# Patient Record
Sex: Male | Born: 1935
Health system: Southern US, Community
[De-identification: ages and names within clinical notes are randomized; demographics above are authoritative.]

## PROBLEM LIST (undated history)

## (undated) DIAGNOSIS — I10 Essential (primary) hypertension: Secondary | ICD-10-CM

## (undated) DIAGNOSIS — G14 Postpolio syndrome: Secondary | ICD-10-CM

## (undated) DIAGNOSIS — C189 Malignant neoplasm of colon, unspecified: Secondary | ICD-10-CM

## (undated) DIAGNOSIS — I639 Cerebral infarction, unspecified: Secondary | ICD-10-CM

## (undated) DIAGNOSIS — R0902 Hypoxemia: Secondary | ICD-10-CM

## (undated) DIAGNOSIS — E785 Hyperlipidemia, unspecified: Secondary | ICD-10-CM

## (undated) DIAGNOSIS — E079 Disorder of thyroid, unspecified: Secondary | ICD-10-CM

## (undated) DIAGNOSIS — C439 Malignant melanoma of skin, unspecified: Secondary | ICD-10-CM

## (undated) DIAGNOSIS — F419 Anxiety disorder, unspecified: Secondary | ICD-10-CM

## (undated) DIAGNOSIS — H269 Unspecified cataract: Secondary | ICD-10-CM

## (undated) DIAGNOSIS — M109 Gout, unspecified: Secondary | ICD-10-CM

## (undated) HISTORY — DX: Postpolio syndrome: G14

## (undated) HISTORY — DX: Unspecified cataract: H26.9

## (undated) HISTORY — DX: Cerebral infarction, unspecified: I63.9

## (undated) HISTORY — DX: Malignant neoplasm of colon, unspecified: C18.9

## (undated) HISTORY — PX: COLON SURGERY: SHX602

## (undated) HISTORY — DX: Hyperlipidemia, unspecified: E78.5

## (undated) HISTORY — PX: HERNIA REPAIR: SHX51

## (undated) HISTORY — DX: Disorder of thyroid, unspecified: E07.9

## (undated) HISTORY — DX: Hypoxemia: R09.02

## (undated) HISTORY — PX: MELANOMA EXCISION: SHX5266

## (undated) HISTORY — DX: Essential (primary) hypertension: I10

## (undated) HISTORY — PX: CHOLECYSTECTOMY: SHX55

## (undated) HISTORY — DX: Anxiety disorder, unspecified: F41.9

## (undated) HISTORY — PX: CATARACT EXTRACTION: SUR2

## (undated) HISTORY — DX: Malignant melanoma of skin, unspecified: C43.9

## (undated) HISTORY — PX: CARPAL TUNNEL RELEASE: SHX101

## (undated) HISTORY — DX: Gout, unspecified: M10.9

## (undated) HISTORY — PX: APPENDECTOMY: SHX54

## (undated) HISTORY — PX: FRACTURE SURGERY: SHX138

---

## 2010-03-10 DIAGNOSIS — C189 Malignant neoplasm of colon, unspecified: Secondary | ICD-10-CM

## 2010-03-10 HISTORY — DX: Malignant neoplasm of colon, unspecified: C18.9

## 2013-09-13 LAB — HM COLONOSCOPY

## 2016-04-14 DIAGNOSIS — M25511 Pain in right shoulder: Secondary | ICD-10-CM | POA: Diagnosis not present

## 2016-04-14 DIAGNOSIS — M75121 Complete rotator cuff tear or rupture of right shoulder, not specified as traumatic: Secondary | ICD-10-CM | POA: Diagnosis not present

## 2016-06-04 DIAGNOSIS — N471 Phimosis: Secondary | ICD-10-CM | POA: Diagnosis not present

## 2016-06-04 DIAGNOSIS — R351 Nocturia: Secondary | ICD-10-CM | POA: Diagnosis not present

## 2016-06-09 DIAGNOSIS — H6120 Impacted cerumen, unspecified ear: Secondary | ICD-10-CM | POA: Diagnosis not present

## 2016-06-12 DIAGNOSIS — Z01818 Encounter for other preprocedural examination: Secondary | ICD-10-CM | POA: Diagnosis not present

## 2016-06-12 DIAGNOSIS — N471 Phimosis: Secondary | ICD-10-CM | POA: Diagnosis not present

## 2016-06-17 DIAGNOSIS — Q549 Hypospadias, unspecified: Secondary | ICD-10-CM | POA: Diagnosis not present

## 2016-06-17 DIAGNOSIS — G309 Alzheimer's disease, unspecified: Secondary | ICD-10-CM | POA: Diagnosis not present

## 2016-06-17 DIAGNOSIS — N481 Balanitis: Secondary | ICD-10-CM | POA: Diagnosis not present

## 2016-06-17 DIAGNOSIS — R351 Nocturia: Secondary | ICD-10-CM | POA: Diagnosis not present

## 2016-06-17 DIAGNOSIS — I10 Essential (primary) hypertension: Secondary | ICD-10-CM | POA: Diagnosis not present

## 2016-06-17 DIAGNOSIS — Z6832 Body mass index (BMI) 32.0-32.9, adult: Secondary | ICD-10-CM | POA: Diagnosis not present

## 2016-06-17 DIAGNOSIS — F329 Major depressive disorder, single episode, unspecified: Secondary | ICD-10-CM | POA: Diagnosis not present

## 2016-06-17 DIAGNOSIS — N400 Enlarged prostate without lower urinary tract symptoms: Secondary | ICD-10-CM | POA: Diagnosis not present

## 2016-06-17 DIAGNOSIS — N471 Phimosis: Secondary | ICD-10-CM | POA: Diagnosis not present

## 2016-06-17 DIAGNOSIS — E039 Hypothyroidism, unspecified: Secondary | ICD-10-CM | POA: Diagnosis not present

## 2016-06-17 DIAGNOSIS — E669 Obesity, unspecified: Secondary | ICD-10-CM | POA: Diagnosis not present

## 2016-06-17 DIAGNOSIS — F419 Anxiety disorder, unspecified: Secondary | ICD-10-CM | POA: Diagnosis not present

## 2016-06-18 DIAGNOSIS — N471 Phimosis: Secondary | ICD-10-CM | POA: Diagnosis not present

## 2016-06-18 DIAGNOSIS — Q549 Hypospadias, unspecified: Secondary | ICD-10-CM | POA: Diagnosis not present

## 2016-06-18 DIAGNOSIS — N481 Balanitis: Secondary | ICD-10-CM | POA: Diagnosis not present

## 2016-07-04 DIAGNOSIS — D7589 Other specified diseases of blood and blood-forming organs: Secondary | ICD-10-CM | POA: Diagnosis not present

## 2016-07-04 DIAGNOSIS — I10 Essential (primary) hypertension: Secondary | ICD-10-CM | POA: Diagnosis not present

## 2016-07-08 DIAGNOSIS — Z7189 Other specified counseling: Secondary | ICD-10-CM | POA: Diagnosis not present

## 2016-07-08 DIAGNOSIS — Z0000 Encounter for general adult medical examination without abnormal findings: Secondary | ICD-10-CM | POA: Diagnosis not present

## 2016-07-08 DIAGNOSIS — Z85038 Personal history of other malignant neoplasm of large intestine: Secondary | ICD-10-CM | POA: Diagnosis not present

## 2016-07-08 DIAGNOSIS — B91 Sequelae of poliomyelitis: Secondary | ICD-10-CM | POA: Diagnosis not present

## 2016-07-08 DIAGNOSIS — I10 Essential (primary) hypertension: Secondary | ICD-10-CM | POA: Diagnosis not present

## 2016-07-08 DIAGNOSIS — E039 Hypothyroidism, unspecified: Secondary | ICD-10-CM | POA: Diagnosis not present

## 2016-07-08 DIAGNOSIS — E785 Hyperlipidemia, unspecified: Secondary | ICD-10-CM | POA: Diagnosis not present

## 2016-08-13 DIAGNOSIS — H903 Sensorineural hearing loss, bilateral: Secondary | ICD-10-CM | POA: Diagnosis not present

## 2016-08-13 DIAGNOSIS — Z974 Presence of external hearing-aid: Secondary | ICD-10-CM | POA: Diagnosis not present

## 2016-09-17 DIAGNOSIS — I10 Essential (primary) hypertension: Secondary | ICD-10-CM | POA: Diagnosis not present

## 2016-09-17 DIAGNOSIS — R0902 Hypoxemia: Secondary | ICD-10-CM | POA: Diagnosis not present

## 2016-09-17 DIAGNOSIS — I080 Rheumatic disorders of both mitral and aortic valves: Secondary | ICD-10-CM | POA: Diagnosis not present

## 2016-09-22 DIAGNOSIS — M25512 Pain in left shoulder: Secondary | ICD-10-CM | POA: Diagnosis not present

## 2016-09-22 DIAGNOSIS — M7552 Bursitis of left shoulder: Secondary | ICD-10-CM | POA: Diagnosis not present

## 2016-10-22 DIAGNOSIS — L218 Other seborrheic dermatitis: Secondary | ICD-10-CM | POA: Diagnosis not present

## 2016-10-22 DIAGNOSIS — Z85828 Personal history of other malignant neoplasm of skin: Secondary | ICD-10-CM | POA: Diagnosis not present

## 2016-10-22 DIAGNOSIS — L578 Other skin changes due to chronic exposure to nonionizing radiation: Secondary | ICD-10-CM | POA: Diagnosis not present

## 2016-10-22 DIAGNOSIS — Z85820 Personal history of malignant melanoma of skin: Secondary | ICD-10-CM | POA: Diagnosis not present

## 2016-12-15 DIAGNOSIS — Z23 Encounter for immunization: Secondary | ICD-10-CM | POA: Diagnosis not present

## 2016-12-17 DIAGNOSIS — Z974 Presence of external hearing-aid: Secondary | ICD-10-CM | POA: Diagnosis not present

## 2016-12-17 DIAGNOSIS — H903 Sensorineural hearing loss, bilateral: Secondary | ICD-10-CM | POA: Diagnosis not present

## 2016-12-29 DIAGNOSIS — E039 Hypothyroidism, unspecified: Secondary | ICD-10-CM | POA: Diagnosis not present

## 2016-12-29 DIAGNOSIS — E785 Hyperlipidemia, unspecified: Secondary | ICD-10-CM | POA: Diagnosis not present

## 2017-03-05 DIAGNOSIS — I10 Essential (primary) hypertension: Secondary | ICD-10-CM | POA: Diagnosis not present

## 2017-03-05 DIAGNOSIS — R413 Other amnesia: Secondary | ICD-10-CM | POA: Diagnosis not present

## 2017-03-05 DIAGNOSIS — E039 Hypothyroidism, unspecified: Secondary | ICD-10-CM | POA: Diagnosis not present

## 2017-03-05 DIAGNOSIS — R0902 Hypoxemia: Secondary | ICD-10-CM | POA: Diagnosis not present

## 2017-03-05 DIAGNOSIS — E785 Hyperlipidemia, unspecified: Secondary | ICD-10-CM | POA: Diagnosis not present

## 2017-03-05 DIAGNOSIS — M25519 Pain in unspecified shoulder: Secondary | ICD-10-CM | POA: Diagnosis not present

## 2017-04-29 DIAGNOSIS — H9193 Unspecified hearing loss, bilateral: Secondary | ICD-10-CM | POA: Diagnosis not present

## 2017-04-29 DIAGNOSIS — I10 Essential (primary) hypertension: Secondary | ICD-10-CM | POA: Diagnosis not present

## 2017-04-29 DIAGNOSIS — H6122 Impacted cerumen, left ear: Secondary | ICD-10-CM | POA: Diagnosis not present

## 2017-05-26 DIAGNOSIS — E039 Hypothyroidism, unspecified: Secondary | ICD-10-CM | POA: Diagnosis not present

## 2017-05-26 LAB — HEPATIC FUNCTION PANEL
ALT: 35 (ref 10–40)
AST: 45 — AB (ref 14–40)
Alkaline Phosphatase: 73 (ref 25–125)

## 2017-05-26 LAB — BASIC METABOLIC PANEL
BUN: 21 (ref 4–21)
CREATININE: 0.8 (ref 0.6–1.3)
Glucose: 91
Potassium: 4.5 (ref 3.4–5.3)
Sodium: 142 (ref 137–147)

## 2017-05-26 LAB — TSH: TSH: 7.98 — AB (ref 0.41–5.90)

## 2017-06-01 DIAGNOSIS — I10 Essential (primary) hypertension: Secondary | ICD-10-CM | POA: Diagnosis not present

## 2017-06-01 DIAGNOSIS — E039 Hypothyroidism, unspecified: Secondary | ICD-10-CM | POA: Diagnosis not present

## 2017-06-25 DIAGNOSIS — R0902 Hypoxemia: Secondary | ICD-10-CM | POA: Diagnosis not present

## 2017-06-25 DIAGNOSIS — I10 Essential (primary) hypertension: Secondary | ICD-10-CM | POA: Diagnosis not present

## 2017-06-25 DIAGNOSIS — E039 Hypothyroidism, unspecified: Secondary | ICD-10-CM | POA: Diagnosis not present

## 2017-06-25 DIAGNOSIS — B91 Sequelae of poliomyelitis: Secondary | ICD-10-CM | POA: Diagnosis not present

## 2017-09-17 ENCOUNTER — Ambulatory Visit: Payer: Self-pay | Admitting: Family Medicine

## 2017-09-18 ENCOUNTER — Ambulatory Visit (INDEPENDENT_AMBULATORY_CARE_PROVIDER_SITE_OTHER): Payer: Medicare Other | Admitting: Family Medicine

## 2017-09-18 ENCOUNTER — Ambulatory Visit: Payer: Self-pay | Admitting: Family Medicine

## 2017-09-18 ENCOUNTER — Encounter: Payer: Self-pay | Admitting: Family Medicine

## 2017-09-18 VITALS — BP 122/68 | HR 64 | Temp 98.4°F | Resp 16 | Wt 237.0 lb

## 2017-09-18 DIAGNOSIS — G14 Postpolio syndrome: Secondary | ICD-10-CM | POA: Diagnosis not present

## 2017-09-18 DIAGNOSIS — H9193 Unspecified hearing loss, bilateral: Secondary | ICD-10-CM | POA: Diagnosis not present

## 2017-09-18 DIAGNOSIS — Z85038 Personal history of other malignant neoplasm of large intestine: Secondary | ICD-10-CM | POA: Diagnosis not present

## 2017-09-18 DIAGNOSIS — I10 Essential (primary) hypertension: Secondary | ICD-10-CM | POA: Diagnosis not present

## 2017-09-18 DIAGNOSIS — E039 Hypothyroidism, unspecified: Secondary | ICD-10-CM | POA: Diagnosis not present

## 2017-09-18 DIAGNOSIS — Z8673 Personal history of transient ischemic attack (TIA), and cerebral infarction without residual deficits: Secondary | ICD-10-CM

## 2017-09-18 DIAGNOSIS — H6120 Impacted cerumen, unspecified ear: Secondary | ICD-10-CM | POA: Insufficient documentation

## 2017-09-18 DIAGNOSIS — H6122 Impacted cerumen, left ear: Secondary | ICD-10-CM | POA: Diagnosis not present

## 2017-09-18 DIAGNOSIS — R4189 Other symptoms and signs involving cognitive functions and awareness: Secondary | ICD-10-CM

## 2017-09-18 DIAGNOSIS — Z85820 Personal history of malignant melanoma of skin: Secondary | ICD-10-CM | POA: Diagnosis not present

## 2017-09-18 DIAGNOSIS — H9190 Unspecified hearing loss, unspecified ear: Secondary | ICD-10-CM | POA: Insufficient documentation

## 2017-09-18 DIAGNOSIS — M1A9XX0 Chronic gout, unspecified, without tophus (tophi): Secondary | ICD-10-CM | POA: Diagnosis not present

## 2017-09-18 NOTE — Patient Instructions (Signed)
Earwax Buildup, Adult The ears produce a substance called earwax that helps keep bacteria out of the ear and protects the skin in the ear canal. Occasionally, earwax can build up in the ear and cause discomfort or hearing loss. What increases the risk? This condition is more likely to develop in people who:  Are male.  Are elderly.  Naturally produce more earwax.  Clean their ears often with cotton swabs.  Use earplugs often.  Use in-ear headphones often.  Wear hearing aids.  Have narrow ear canals.  Have earwax that is overly thick or sticky.  Have eczema.  Are dehydrated.  Have excess hair in the ear canal.  What are the signs or symptoms? Symptoms of this condition include:  Reduced or muffled hearing.  A feeling of fullness in the ear or feeling that the ear is plugged.  Fluid coming from the ear.  Ear pain.  Ear itch.  Ringing in the ear.  Coughing.  An obvious piece of earwax that can be seen inside the ear canal.  How is this diagnosed? This condition may be diagnosed based on:  Your symptoms.  Your medical history.  An ear exam. During the exam, your health care provider will look into your ear with an instrument called an otoscope.  You may have tests, including a hearing test. How is this treated? This condition may be treated by:  Using ear drops to soften the earwax.  Having the earwax removed by a health care provider. The health care provider may: ? Flush the ear with water. ? Use an instrument that has a loop on the end (curette). ? Use a suction device.  Surgery to remove the wax buildup. This may be done in severe cases.  Follow these instructions at home:  Take over-the-counter and prescription medicines only as told by your health care provider.  Do not put any objects, including cotton swabs, into your ear. You can clean the opening of your ear canal with a washcloth or facial tissue.  Follow instructions from your health  care provider about cleaning your ears. Do not over-clean your ears.  Drink enough fluid to keep your urine clear or pale yellow. This will help to thin the earwax.  Keep all follow-up visits as told by your health care provider. If earwax builds up in your ears often or if you use hearing aids, consider seeing your health care provider for routine, preventive ear cleanings. Ask your health care provider how often you should schedule your cleanings.  If you have hearing aids, clean them according to instructions from the manufacturer and your health care provider. Contact a health care provider if:  You have ear pain.  You develop a fever.  You have blood, pus, or other fluid coming from your ear.  You have hearing loss.  You have ringing in your ears that does not go away.  Your symptoms do not improve with treatment.  You feel like the room is spinning (vertigo). Summary  Earwax can build up in the ear and cause discomfort or hearing loss.  The most common symptoms of this condition include reduced or muffled hearing and a feeling of fullness in the ear or feeling that the ear is plugged.  This condition may be diagnosed based on your symptoms, your medical history, and an ear exam.  This condition may be treated by using ear drops to soften the earwax or by having the earwax removed by a health care provider.  Do   not put any objects, including cotton swabs, into your ear. You can clean the opening of your ear canal with a washcloth or facial tissue. This information is not intended to replace advice given to you by your health care provider. Make sure you discuss any questions you have with your health care provider. Document Released: 04/03/2004 Document Revised: 05/07/2016 Document Reviewed: 05/07/2016 Elsevier Interactive Patient Education  2018 [employer]vier Inc[employer]  
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
## 2017-09-22 DIAGNOSIS — M109 Gout, unspecified: Secondary | ICD-10-CM | POA: Insufficient documentation

## 2017-09-22 NOTE — Assessment & Plan Note (Signed)
Despite wearing hearing aids today, patient seems to have significant hearing loss that affects his ability to participate in our conversation Referral to ENT to see if there are any other interventions that may help with this as it seems to affect his quality of life

## 2017-09-22 NOTE — Assessment & Plan Note (Signed)
Reportedly previously well controlled Declines labs as above as he recently had these at previous PCP We will request records before we obtain labs here Can continue Synthroid 112 mcg daily

## 2017-09-22 NOTE — Assessment & Plan Note (Signed)
Referral to dermatology for ongoing management and surveillance

## 2017-09-22 NOTE — Assessment & Plan Note (Signed)
Referral to GI for ongoing management and surveillance

## 2017-09-22 NOTE — Assessment & Plan Note (Signed)
Chronic and stable with no recent flares Continue allopurinol Continue colchicine as needed Check uric acid level with next labs 1 week and get these

## 2017-09-22 NOTE — Assessment & Plan Note (Signed)
Patient with right leg weakness secondary to post polio syndrome Needs new bracing and assistive devices Referral to PM&R for evaluation and management Do not think that patient is safe to drive and will provide DMV letter to daughter in law recommending such

## 2017-09-22 NOTE — Assessment & Plan Note (Signed)
Currently well controlled Continue atenolol and nifedipine at current doses Patient reports recent lab work done at previous PCP in [place] and declines lab work today We will request records before obtaining labs here

## 2017-09-22 NOTE — Assessment & Plan Note (Signed)
Patient is without deficits currently Continue aspirin and good blood pressure and cholesterol control

## 2017-09-22 NOTE — Assessment & Plan Note (Signed)
Chronic and stable per family Continue Aricept 10 mg nightly

## 2017-09-22 NOTE — Assessment & Plan Note (Signed)
May be contributing to some of his hearing loss It is very thick and hardened and will be difficult to remove He is being referred to ENT as above, so I hope they will be able to help with this as well

## 2017-10-06 DIAGNOSIS — H903 Sensorineural hearing loss, bilateral: Secondary | ICD-10-CM | POA: Diagnosis not present

## 2017-10-06 DIAGNOSIS — H6122 Impacted cerumen, left ear: Secondary | ICD-10-CM | POA: Diagnosis not present

## 2017-10-12 ENCOUNTER — Encounter: Payer: Self-pay | Admitting: *Deleted

## 2017-10-21 ENCOUNTER — Ambulatory Visit: Payer: Medicare Other | Admitting: Family Medicine

## 2017-10-21 ENCOUNTER — Other Ambulatory Visit: Payer: Self-pay

## 2017-10-21 MED ORDER — ATENOLOL 25 MG PO TABS: 25.0000 mg | ORAL_TABLET | Freq: Every day | ORAL | 3 refills | Status: DC

## 2017-10-21 MED ORDER — ALLOPURINOL 300 MG PO TABS: 300.0000 mg | ORAL_TABLET | Freq: Every day | ORAL | 3 refills | Status: DC

## 2017-10-21 MED ORDER — COLCHICINE 0.6 MG PO TABS: 0.6000 mg | ORAL_TABLET | Freq: Every day | ORAL | 3 refills | Status: DC

## 2017-10-28 DIAGNOSIS — L219 Seborrheic dermatitis, unspecified: Secondary | ICD-10-CM | POA: Diagnosis not present

## 2017-10-28 DIAGNOSIS — L820 Inflamed seborrheic keratosis: Secondary | ICD-10-CM | POA: Diagnosis not present

## 2017-10-28 DIAGNOSIS — Z85820 Personal history of malignant melanoma of skin: Secondary | ICD-10-CM | POA: Diagnosis not present

## 2017-10-28 DIAGNOSIS — L821 Other seborrheic keratosis: Secondary | ICD-10-CM | POA: Diagnosis not present

## 2017-10-28 DIAGNOSIS — D1800 Hemangioma unspecified site: Secondary | ICD-10-CM | POA: Diagnosis not present

## 2017-10-28 DIAGNOSIS — Z1283 Encounter for screening for malignant neoplasm of skin: Secondary | ICD-10-CM | POA: Diagnosis not present

## 2017-10-28 DIAGNOSIS — R21 Rash and other nonspecific skin eruption: Secondary | ICD-10-CM | POA: Diagnosis not present

## 2017-11-05 ENCOUNTER — Encounter: Payer: Self-pay | Admitting: Family Medicine

## 2017-11-05 NOTE — Progress Notes (Signed)
Medical records received from [facility] in [place].  Medical history, health maintenance, and vaccinations updated in the chart.  Most recent labs abstracted  Bacigalupo, Dionne Bucy, MD, MPH [facility] 11/05/2017 11:41 AM

## 2017-11-20 DIAGNOSIS — G14 Postpolio syndrome: Secondary | ICD-10-CM | POA: Diagnosis not present

## 2017-11-20 DIAGNOSIS — I6302 Cerebral infarction due to thrombosis of basilar artery: Secondary | ICD-10-CM | POA: Diagnosis not present

## 2017-11-25 ENCOUNTER — Ambulatory Visit (INDEPENDENT_AMBULATORY_CARE_PROVIDER_SITE_OTHER): Payer: Medicare Other | Admitting: Family Medicine

## 2017-11-25 ENCOUNTER — Encounter: Payer: Self-pay | Admitting: Family Medicine

## 2017-11-25 VITALS — BP 144/82 | HR 59 | Temp 97.8°F | Ht 72.0 in | Wt 231.4 lb

## 2017-11-25 DIAGNOSIS — Z7189 Other specified counseling: Secondary | ICD-10-CM | POA: Diagnosis not present

## 2017-11-25 DIAGNOSIS — Z23 Encounter for immunization: Secondary | ICD-10-CM | POA: Diagnosis not present

## 2017-11-25 DIAGNOSIS — I10 Essential (primary) hypertension: Secondary | ICD-10-CM | POA: Diagnosis not present

## 2017-11-25 DIAGNOSIS — Z8673 Personal history of transient ischemic attack (TIA), and cerebral infarction without residual deficits: Secondary | ICD-10-CM | POA: Diagnosis not present

## 2017-11-25 DIAGNOSIS — M1A9XX0 Chronic gout, unspecified, without tophus (tophi): Secondary | ICD-10-CM | POA: Diagnosis not present

## 2017-11-25 DIAGNOSIS — E039 Hypothyroidism, unspecified: Secondary | ICD-10-CM

## 2017-11-25 DIAGNOSIS — E785 Hyperlipidemia, unspecified: Secondary | ICD-10-CM

## 2017-11-25 DIAGNOSIS — G14 Postpolio syndrome: Secondary | ICD-10-CM

## 2017-11-25 NOTE — Progress Notes (Signed)
Patient: Samuel Jones Male    DOB: 12/17/1933   84 y.o.   MRN: [id number] Visit Date: 11/25/2017  Today's Provider: Lavon Paganini, MD   Chief Complaint  Patient presents with  . Hypertension  . Hypothyroidism   Subjective:    I, Tiburcio Pea, CMA, am acting as a Education administrator for Lavon Paganini, MD.   HPI  Hypertension, follow-up:  BP Readings from Last 3 Encounters:  11/25/17 (!) 144/82  09/18/17 122/68    He was last seen for hypertension 2 months ago.  BP at that visit was 122/68. Management changes since that visit include no changes continue medication. He reports good compliance with treatment. He is not having side effects.  He is not exercising. He is not adherent to low salt diet.   Outside blood pressures are not being checked at home.  At recent visit at [facility] was well controlled. Believe that it is elevated due to argument on the way here today. He is experiencing none.  Patient denies chest pain, chest pressure/discomfort, claudication, dyspnea, exertional chest pressure/discomfort, fatigue, irregular heart beat, lower extremity edema, near-syncope, orthopnea, palpitations, paroxysmal nocturnal dyspnea, syncope and tachypnea.   Cardiovascular risk factors include advanced age (older than 29 for men, 8 for women), hypertension and male gender.  Use of agents associated with hypertension: thyroid hormones.     Weight trend: stable Wt Readings from Last 3 Encounters:  11/25/17 231 lb 6.4 oz (105 kg)  09/18/17 237 lb (107.5 kg)    Current diet: in general, an "unhealthy" diet  ------------------------------------------------------------------------  Hypothyroid, follow-up:  TSH  Date Value Ref Range Status  05/26/2017 7.98 (A) 0.41 - 5.90 Final   Wt Readings from Last 3 Encounters:  11/25/17 231 lb 6.4 oz (105 kg)  09/18/17 237 lb (107.5 kg)    He was last seen for hypothyroid 2 months ago.  Management since that visit includes continue  medication. He reports good compliance with treatment. He is not having side effects.  He is not exercising. He is experiencing none He denies change in energy level, diarrhea, heat / cold intolerance, nervousness, palpitations and weight changes Weight trend: stable  ------------------------------------------------------------------------ Discussed options for interventions in the future.  Patient has h/o colon cancer and is supposed to have repeat colonoscopy.  He states that he does not wish to have invasive tests.  He knows there is a chance of colon cancer recurrence, but he states that he would not treat it if that were the case anyways.  He does wish to proceed with physical therapy and leg bracing minimally invasive things that can improve his lifestyle.  He will continue to get lab work.  He would think about hospitalization if this was needed.  He is okay with antibiotics and medical interventions such as that.  He is unsure whether he would take a statin even when considering his previous CVA.  His son, who is his healthcare power of attorney, is present for this conversation.  He agrees with the discussion.  The patient also states that he would like to continue to eat what he wants and do what he wants.     No Known Allergies   Current Outpatient Medications:  .  acetaminophen (TYLENOL) 325 MG tablet, Take 650 mg by mouth every 6 (six) hours as needed., Disp: , Rfl:  .  allopurinol (ZYLOPRIM) 300 MG tablet, Take 1 tablet (300 mg total) by mouth daily., Disp: 30 tablet, Rfl: 3 .  aspirin EC  81 MG tablet, Take 81 mg by mouth daily., Disp: , Rfl:  .  atenolol (TENORMIN) 25 MG tablet, Take 1 tablet (25 mg total) by mouth daily., Disp: 30 tablet, Rfl: 3 .  clobetasol (TEMOVATE) 0.05 % external solution, Apply 1 application topically 2 (two) times daily., Disp: , Rfl:  .  colchicine 0.6 MG tablet, Take 1 tablet (0.6 mg total) by mouth daily., Disp: 30 tablet, Rfl: 3 .  donepezil  (ARICEPT) 10 MG tablet, Take 10 mg by mouth at bedtime., Disp: , Rfl:  .  fluticasone (FLONASE) 50 MCG/ACT nasal spray, Place into both nostrils daily., Disp: , Rfl:  .  ketoconazole (NIZORAL) 2 % shampoo, Apply 1 application topically 2 (two) times a week., Disp: , Rfl:  .  levothyroxine (SYNTHROID, LEVOTHROID) 112 MCG tablet, Take 112 mcg by mouth daily before breakfast., Disp: , Rfl:  .  Multiple Vitamin (MULTIVITAMIN) capsule, Take 1 capsule by mouth daily., Disp: , Rfl:  .  Multiple Vitamins-Minerals (PRESERVISION AREDS PO), Take by mouth., Disp: , Rfl:  .  NIFEdipine (PROCARDIA-XL/ADALAT CC) 30 MG 24 hr tablet, Take 60 mg by mouth daily., Disp: , Rfl:  .  nystatin-triamcinolone (MYCOLOG II) cream, Apply 1 application topically 2 (two) times daily., Disp: , Rfl:  .  Omega-3 Fatty Acids (FISH OIL) 1000 MG CAPS, Take 3 capsules by mouth daily., Disp: , Rfl:  .  OXYGEN, Inhale into the lungs. 4 Liters O2 nightly, Disp: , Rfl:   Review of Systems  Constitutional: Negative.   HENT: Negative.   Respiratory: Negative.   Cardiovascular: Negative.   Gastrointestinal: Negative.   Genitourinary: Negative.   Musculoskeletal: Negative.   Neurological: Negative.   Psychiatric/Behavioral: Negative.     Social History   Tobacco Use  . Smoking status: Former Smoker    Types: Cigarettes    Last attempt to quit: 03/09/1974    Years since quitting: 43.7  . Smokeless tobacco: Never Used  Substance Use Topics  . Alcohol use: Never    Frequency: Never   Objective:   BP (!) 144/82 (BP Location: Left Arm, Patient Position: Sitting, Cuff Size: Large)   Pulse (!) 59   Temp 97.8 F (36.6 C) (Oral)   Ht 6' (1.829 m)   Wt 231 lb 6.4 oz (105 kg)   SpO2 95%   BMI 31.38 kg/m  Vitals:   11/25/17 1556  BP: (!) 144/82  Pulse: (!) 59  Temp: 97.8 F (36.6 C)  TempSrc: Oral  SpO2: 95%  Weight: 231 lb 6.4 oz (105 kg)  Height: 6' (1.829 m)     Physical Exam  Constitutional: He is oriented to  person, place, and time. He appears well-developed and well-nourished. No distress.  Walks with walker  HENT:  Head: Normocephalic and atraumatic.  Mouth/Throat: Oropharynx is clear and moist.  Eyes: Pupils are equal, round, and reactive to light. Conjunctivae are normal. Right eye exhibits no discharge. Left eye exhibits no discharge. No scleral icterus.  Neck: Neck supple. No thyromegaly present.  Cardiovascular: Normal rate, regular rhythm, normal heart sounds and intact distal pulses.  No murmur heard. Pulmonary/Chest: Effort normal and breath sounds normal. No respiratory distress. He has no wheezes. He has no rales.  Abdominal: Soft. He exhibits no distension. There is no tenderness.  Musculoskeletal: He exhibits no edema.  Lymphadenopathy:    He has no cervical adenopathy.  Neurological: He is alert and oriented to person, place, and time.  Skin: Skin is warm and dry. Capillary refill  takes less than 2 seconds. No rash noted.  Psychiatric: He has a normal mood and affect. His behavior is normal.  Vitals reviewed.      Assessment & Plan:   Problem List Items Addressed This Visit      Cardiovascular and Mediastinum   Essential hypertension - Primary    Chronic, slightly elevated today Has been well controlled at several recent visits Seems to be elevated today due to argument Continue atenolol and nifedipine at current doses Reviewed last labs Check CMP today      Relevant Orders   Comprehensive metabolic panel (Completed)     Endocrine   Hypothyroidism    Recheck TSH Continue Synthroid at current dose Pending lab results, consider dose titration as indicated      Relevant Orders   TSH (Completed)     Nervous and Auditory   Post-polio syndrome    Patient with right leg weakness secondary to post polio syndrome He has been seen by [facility] for bracing and assistive devices They have ordered physical therapy as well It has been discussed in the past that he is  not safe to drive due to these deficits and letter has been given to the [facility]        Other   History of CVA (cerebrovascular accident)    No remaining deficits Continue aspirin Discussed importance of good blood pressure control Check lipid panel Not currently on a statin Would need to have discussion if cholesterol is elevated and he needs a statin      Relevant Orders   Lipid panel (Completed)   Gout    Chronic and stable with no recent flares Continue allopurinol Continue colchicine as needed Recheck uric acid level-goal less than 6      Relevant Orders   Uric acid (Completed)   Advanced care planning/counseling discussion    Approximately 20-minute discussion regarding advanced care planning Patient Levi Aland has a living will and healthcare power of attorney His son, who is his healthcare power of attorney, is present for the discussion and agrees Patient does not wish to have significant intervention at this time He knows the risk of recurrent colon cancer, but does not want to have this checked with colonoscopy He will continue to have patient sooner discussions regarding ongoing treatment       Other Visit Diagnoses    Need for influenza vaccination       Relevant Orders   Flu vaccine HIGH DOSE PF (Completed)   Hyperlipidemia, unspecified hyperlipidemia type       Relevant Orders   Lipid panel (Completed)       Return in about 6 months (around 05/26/2018) for AWV.   The entirety of the information documented in the History of Present Illness, Review of Systems and Physical Exam were personally obtained by me. Portions of this information were initially documented by Tiburcio Pea, CMA and reviewed by me for thoroughness and accuracy.    Virginia Crews, MD, MPH [facility] 11/26/2017 11:11 AM

## 2017-11-25 NOTE — Patient Instructions (Signed)

## 2017-11-26 ENCOUNTER — Telehealth: Payer: Self-pay

## 2017-11-26 ENCOUNTER — Other Ambulatory Visit: Payer: Self-pay | Admitting: Family Medicine

## 2017-11-26 DIAGNOSIS — Z7189 Other specified counseling: Secondary | ICD-10-CM | POA: Insufficient documentation

## 2017-11-26 LAB — COMPREHENSIVE METABOLIC PANEL
ALBUMIN: 4.3 g/dL (ref 3.5–4.7)
ALT: 38 IU/L (ref 0–44)
AST: 33 IU/L (ref 0–40)
Albumin/Globulin Ratio: 1.9 (ref 1.2–2.2)
Alkaline Phosphatase: 93 IU/L (ref 39–117)
BUN / CREAT RATIO: 22 (ref 10–24)
BUN: 15 mg/dL (ref 8–27)
Bilirubin Total: 0.4 mg/dL (ref 0.0–1.2)
CO2: 25 mmol/L (ref 20–29)
Calcium: 9.7 mg/dL (ref 8.6–10.2)
Chloride: 102 mmol/L (ref 96–106)
Creatinine, Ser: 0.69 mg/dL — ABNORMAL LOW (ref 0.76–1.27)
GFR calc non Af Amer: 88 mL/min/{1.73_m2} (ref >59)
GFR, EST AFRICAN AMERICAN: 102 mL/min/{1.73_m2} (ref >59)
GLOBULIN, TOTAL: 2.3 g/dL (ref 1.5–4.5)
GLUCOSE: 102 mg/dL — AB (ref 65–99)
Potassium: 4.7 mmol/L (ref 3.5–5.2)
SODIUM: 141 mmol/L (ref 134–144)
TOTAL PROTEIN: 6.6 g/dL (ref 6.0–8.5)

## 2017-11-26 LAB — LIPID PANEL
CHOLESTEROL TOTAL: 152 mg/dL (ref 100–199)
Chol/HDL Ratio: 3.5 ratio (ref 0.0–5.0)
HDL: 43 mg/dL (ref >39)
LDL CALC: 62 mg/dL (ref 0–99)
TRIGLYCERIDES: 236 mg/dL — AB (ref 0–149)
VLDL Cholesterol Cal: 47 mg/dL — ABNORMAL HIGH (ref 5–40)

## 2017-11-26 LAB — TSH: TSH: 0.026 u[IU]/mL — ABNORMAL LOW (ref 0.450–4.500)

## 2017-11-26 LAB — URIC ACID: Uric Acid: 4.5 mg/dL (ref 3.7–8.6)

## 2017-11-26 MED ORDER — LEVOTHYROXINE SODIUM 100 MCG PO TABS: 100.0000 ug | ORAL_TABLET | Freq: Every day | ORAL | 3 refills | Status: DC

## 2017-11-26 NOTE — Telephone Encounter (Signed)
-----   Message from Virginia Crews, MD sent at 11/26/2017  9:14 AM EDT ----- Cholesterol is well controlled.  Uric acid, gout level, is well controlled.  Continue allopurinol at current dose.  Normal kidney function, liver function, electrolytes.  Thyroid function shows that Synthroid dose is now too high.  Will decrease Synthroid dose to 100 mcg daily.  New Rx at pharmacy.  Virginia Crews, MD, MPH [facility] 11/26/2017 9:14 AM

## 2017-11-26 NOTE — Telephone Encounter (Signed)
Patient advised as below. Patient verbalizes understanding and is in agreement with treatment plan.  

## 2017-11-26 NOTE — Assessment & Plan Note (Signed)
Patient with right leg weakness secondary to post polio syndrome He has been seen by [facility] for bracing and assistive devices They have ordered physical therapy as well It has been discussed in the past that he is not safe to drive due to these deficits and letter has been given to the [facility]

## 2017-11-26 NOTE — Assessment & Plan Note (Signed)
Recheck TSH Continue Synthroid at current dose Pending lab results, consider dose titration as indicated

## 2017-11-26 NOTE — Assessment & Plan Note (Signed)
Chronic, slightly elevated today Has been well controlled at several recent visits Seems to be elevated today due to argument Continue atenolol and nifedipine at current doses Reviewed last labs Check CMP today

## 2017-11-26 NOTE — Assessment & Plan Note (Signed)
Chronic and stable with no recent flares Continue allopurinol Continue colchicine as needed Recheck uric acid level-goal less than 6

## 2017-11-26 NOTE — Assessment & Plan Note (Signed)
Approximately 20-minute discussion regarding advanced care planning Patient Samuel Jones has a living will and healthcare power of attorney His son, who is his healthcare power of attorney, is present for the discussion and agrees Patient does not wish to have significant intervention at this time He knows the risk of recurrent colon cancer, but does not want to have this checked with colonoscopy He will continue to have patient sooner discussions regarding ongoing treatment

## 2017-11-26 NOTE — Assessment & Plan Note (Signed)
No remaining deficits Continue aspirin Discussed importance of good blood pressure control Check lipid panel Not currently on a statin Would need to have discussion if cholesterol is elevated and he needs a statin

## 2018-01-15 ENCOUNTER — Other Ambulatory Visit: Payer: Self-pay | Admitting: Family Medicine

## 2018-01-17 ENCOUNTER — Other Ambulatory Visit: Payer: Self-pay | Admitting: Family Medicine

## 2018-02-23 ENCOUNTER — Telehealth: Payer: Self-pay | Admitting: Family Medicine

## 2018-02-23 NOTE — Telephone Encounter (Signed)
[facility] [place] needing to confirm if levothyroxine (SYNTHROID, LEVOTHROID)  Should be for 100 or 112 mcg tablet?  They have a script for both for pt.  Please advise.  Thanks, [employer]

## 2018-02-23 NOTE — Telephone Encounter (Signed)
Per last telephone note patient needed to decrease Levothyroxine to 100 mcg. Ena Dawley at [facility] advised.

## 2018-03-19 ENCOUNTER — Telehealth: Payer: Self-pay | Admitting: Family Medicine

## 2018-03-19 NOTE — Telephone Encounter (Signed)
error 

## 2018-03-24 ENCOUNTER — Other Ambulatory Visit: Payer: Self-pay | Admitting: Family Medicine

## 2018-03-25 DIAGNOSIS — H9190 Unspecified hearing loss, unspecified ear: Secondary | ICD-10-CM | POA: Diagnosis not present

## 2018-03-25 DIAGNOSIS — H6122 Impacted cerumen, left ear: Secondary | ICD-10-CM | POA: Diagnosis not present

## 2018-05-04 ENCOUNTER — Other Ambulatory Visit: Payer: Self-pay | Admitting: Family Medicine

## 2018-05-04 MED ORDER — NIFEDIPINE ER 30 MG PO TB24: 60.0000 mg | ORAL_TABLET | Freq: Every day | ORAL | 3 refills | Status: DC

## 2018-05-04 NOTE — Telephone Encounter (Signed)
[facility] refill request for the following medications:  NIFEdipine (PROCARDIA-XL/ADALAT CC) 30 MG 24 hr tablet   Please advise.

## 2018-05-26 ENCOUNTER — Telehealth: Payer: Self-pay

## 2018-05-26 NOTE — Telephone Encounter (Signed)
Patient son Randall Hiss) would like to keep appointment for patient due to big sore on the back of his leg that needs medical attention.

## 2018-05-27 ENCOUNTER — Ambulatory Visit (INDEPENDENT_AMBULATORY_CARE_PROVIDER_SITE_OTHER): Payer: Medicare Other | Admitting: Family Medicine

## 2018-05-27 ENCOUNTER — Other Ambulatory Visit: Payer: Self-pay

## 2018-05-27 ENCOUNTER — Ambulatory Visit: Payer: Medicare Other | Admitting: Family Medicine

## 2018-05-27 ENCOUNTER — Encounter: Payer: Self-pay | Admitting: Family Medicine

## 2018-05-27 VITALS — BP 144/68 | HR 54 | Temp 98.0°F

## 2018-05-27 DIAGNOSIS — B354 Tinea corporis: Secondary | ICD-10-CM

## 2018-05-27 DIAGNOSIS — Z85828 Personal history of other malignant neoplasm of skin: Secondary | ICD-10-CM | POA: Diagnosis not present

## 2018-05-27 DIAGNOSIS — I872 Venous insufficiency (chronic) (peripheral): Secondary | ICD-10-CM

## 2018-05-27 MED ORDER — TRIAMCINOLONE ACETONIDE 0.5 % EX OINT: 1.0000 "application " | TOPICAL_OINTMENT | Freq: Two times a day (BID) | CUTANEOUS | 1 refills | Status: AC

## 2018-05-27 MED ORDER — CLOTRIMAZOLE 1 % EX CREA: 1.0000 "application " | TOPICAL_CREAM | Freq: Two times a day (BID) | CUTANEOUS | 0 refills | Status: DC

## 2018-05-27 NOTE — Progress Notes (Deleted)
Patient: Samuel Jones, Male    DOB: 1933-06-09, 85 y.o.   MRN: [id number] Visit Date: 05/27/2018  Today's Provider: Lavon Paganini, MD   No chief complaint on file.  Subjective:    Annual wellness visit [name] is a 85 y.o. male who presents today for his Subsequent Annual Wellness Visit. He feels {DESC; WELL/FAIRLY WELL/POORLY:18703}. He reports exercising ***. He reports he is sleeping {DESC; WELL/FAIRLY WELL/POORLY:18703}.  -----------------------------------------------------------   Review of Systems  Constitutional: Negative.   HENT: Negative.   Eyes: Negative.   Respiratory: Negative.   Cardiovascular: Negative.   Gastrointestinal: Negative.   Endocrine: Negative.   Genitourinary: Negative.   Musculoskeletal: Negative.   Skin: Negative.   Allergic/Immunologic: Negative.   Neurological: Negative.   Hematological: Negative.   Psychiatric/Behavioral: Negative.     Social History   Socioeconomic History  . Marital status: Married    Spouse name: Not on file  . Number of children: Not on file  . Years of education: Not on file  . Highest education level: Not on file  Occupational History  . Occupation: retired     Comment: Catering manager  Social Needs  . Financial resource strain: Not on file  . Food insecurity:    Worry: Not on file    Inability: Not on file  . Transportation needs:    Medical: Not on file    Non-medical: Not on file  Tobacco Use  . Smoking status: Former Smoker    Types: Cigarettes    Last attempt to quit: 03/09/1974    Years since quitting: 44.2  . Smokeless tobacco: Never Used  Substance and Sexual Activity  . Alcohol use: Never    Frequency: Never  . Drug use: Never  . Sexual activity: Not on file  Lifestyle  . Physical activity:    Days per week: Not on file    Minutes per session: Not on file  . Stress: Not on file  Relationships  . Social connections:    Talks on phone: Not on file    Gets together: Not  on file    Attends religious service: Not on file    Active member of club or organization: Not on file    Attends meetings of clubs or organizations: Not on file    Relationship status: Not on file  . Intimate partner violence:    Fear of current or ex partner: Not on file    Emotionally abused: Not on file    Physically abused: Not on file    Forced sexual activity: Not on file  Other Topics Concern  . Not on file  Social History Narrative  . Not on file    Patient Active Problem List   Diagnosis Date Noted  . Advanced care planning/counseling discussion 11/26/2017  . Gout 09/22/2017  . Post-polio syndrome 09/18/2017  . Loss of hearing 09/18/2017  . Cerumen impaction 09/18/2017  . History of CVA (cerebrovascular accident) 09/18/2017  . History of colon cancer 09/18/2017  . History of melanoma 09/18/2017  . Hypothyroidism 09/18/2017  . Essential hypertension 09/18/2017  . Cognitive impairment 09/18/2017    Past Surgical History:  Procedure Laterality Date  . APPENDECTOMY    . CARPAL TUNNEL RELEASE     R wrist  . CATARACT EXTRACTION Bilateral   . CHOLECYSTECTOMY    . COLON SURGERY    . FRACTURE SURGERY     R knee and L ankle  . HERNIA REPAIR    . MELANOMA EXCISION  His family history includes Diabetes in his mother.     Previous Medications   ACETAMINOPHEN (TYLENOL) 325 MG TABLET    Take 650 mg by mouth every 6 (six) hours as needed.   ALLOPURINOL (ZYLOPRIM) 300 MG TABLET    TAKE 1 TABLET BY MOUTH EVERY DAY   ASPIRIN EC 81 MG TABLET    Take 81 mg by mouth daily.   ATENOLOL (TENORMIN) 25 MG TABLET    TAKE 1 TABLET BY MOUTH EVERY DAY   CLOBETASOL (TEMOVATE) 0.05 % EXTERNAL SOLUTION    Apply 1 application topically 2 (two) times daily.   COLCHICINE 0.6 MG TABLET    TAKE 1 TABLET BY MOUTH EVERY DAY   DONEPEZIL (ARICEPT) 10 MG TABLET    TAKE 1 TABLET BY MOUTH EVERY DAY IN THE EVENING   FLUTICASONE (FLONASE) 50 MCG/ACT NASAL SPRAY    Place into both nostrils  daily.   KETOCONAZOLE (NIZORAL) 2 % SHAMPOO    Apply 1 application topically 2 (two) times a week.   LEVOTHYROXINE (SYNTHROID, LEVOTHROID) 100 MCG TABLET    Take 1 tablet (100 mcg total) by mouth daily before breakfast.   MULTIPLE VITAMIN (MULTIVITAMIN) CAPSULE    Take 1 capsule by mouth daily.   MULTIPLE VITAMINS-MINERALS (PRESERVISION AREDS PO)    Take by mouth.   NIFEDIPINE (ADALAT CC) 30 MG 24 HR TABLET    Take 2 tablets (60 mg total) by mouth daily.   NYSTATIN-TRIAMCINOLONE (MYCOLOG II) CREAM    Apply 1 application topically 2 (two) times daily.   OMEGA-3 FATTY ACIDS (FISH OIL) 1000 MG CAPS    Take 3 capsules by mouth daily.   OXYGEN    Inhale into the lungs. 4 Liters O2 nightly    Patient Care Team: Virginia Crews, MD as PCP - General (Family Medicine)      Objective:   Vitals: There were no vitals taken for this visit.  Physical Exam  Activities of Daily Living In your present state of health, do you have any difficulty performing the following activities: 09/18/2017  Hearing? Y  Vision? N  Difficulty concentrating or making decisions? Y  Walking or climbing stairs? Y  Dressing or bathing? Y  Doing errands, shopping? Y  Some recent data might be hidden    Fall Risk Assessment Fall Risk  09/18/2017  Falls in the past year? Yes  Number falls in past yr: 1  Injury with Fall? No  Follow up Falls evaluation completed     Depression Screen PHQ 2/9 Scores 09/18/2017  PHQ - 2 Score 0    Cognitive Testing - 6-CIT  Correct? Score   What year is it? {yes no:22349} {0-4:31231} 0 or 4  What month is it? {yes no:22349} {0-3:21082} 0 or 3  Memorize:    Samuel Jones,  44,  [place],      What time is it? (within 1 hour) {yes no:22349} {0-3:21082} 0 or 3  Count backwards from 20 {yes no:22349} {0-4:31231} 0, 2, or 4  Name the months of the year {yes no:22349} {0-4:31231} 0, 2, or 4  Repeat name & address above {yes no:22349} {0-10:5044} 0, 2, 4, 6, 8, or 10        TOTAL SCORE  ***/28   Interpretation:  {normal/abnormal:11317::"Normal"}  Normal (0-7) Abnormal (8-28)     Assessment & Plan:     Annual Wellness Visit  Reviewed patient's Family Medical History Reviewed and updated list of patient's medical providers Assessment  of cognitive impairment was done Assessed patient's functional ability Established a written schedule for health screening [place] Completed and Reviewed  Exercise Activities and Dietary recommendations Goals   None     Immunization History  Administered Date(s) Administered  . Influenza, High Dose Seasonal PF 11/25/2017  . Pneumococcal Conjugate-13 01/23/2015  . Pneumococcal Polysaccharide-23 08/17/2011  . Zoster 08/09/2010    Health Maintenance  Topic Date Due  . COLONOSCOPY  09/13/2016  . TETANUS/TDAP  11/26/2023 (Originally 10/22/1952)  . INFLUENZA VACCINE  Completed  . PNA vac Low Risk Adult  Completed     Discussed health benefits of physical activity, and encouraged him to engage in regular exercise appropriate for his age and condition.    ------------------------------------------------------------------------------------------------------------

## 2018-05-27 NOTE — Patient Instructions (Signed)
If rash on thigh is not improving in 1 week, call [facility] for an appointment  Wear compression socks daily. Remove at night.  Oxygen level is normal. Can return oxygen machine   Chronic Venous Insufficiency Chronic venous insufficiency, also called venous stasis, is a condition that prevents blood from being pumped effectively through the veins in your legs. Blood may no longer be pumped effectively from the legs back to the heart. This condition can range from mild to severe. With proper treatment, you should be able to continue with an active life. What are the causes? Chronic venous insufficiency occurs when the vein walls become stretched, weakened, or damaged, or when valves within the vein are damaged. Some common causes of this include:  High blood pressure inside the veins (venous hypertension).  Increased blood pressure in the leg veins from long periods of sitting or standing.  A blood clot that blocks blood flow in a vein (deep vein thrombosis, DVT).  Inflammation of a vein (phlebitis) that causes a blood clot to form.  Tumors in the pelvis that cause blood to back up. What increases the risk? The following factors may make you more likely to develop this condition:  Having a family history of this condition.  Obesity.  Pregnancy.  Living without enough physical activity or exercise (sedentary lifestyle).  Smoking.  Having a job that requires long periods of standing or sitting in one place.  Being a certain age. Women in their 67s and 27s and men in their 64s are more likely to develop this condition. What are the signs or symptoms? Symptoms of this condition include:  Veins that are enlarged, bulging, or twisted (varicose veins).  Skin breakdown or ulcers.  Reddened or discolored skin on the front of the leg.  Brown, smooth, tight, and painful skin just above the ankle, usually on the inside of the leg (lipodermatosclerosis).  Swelling. How is this  diagnosed? This condition may be diagnosed based on:  Your medical history.  A physical exam.  Tests, such as: ? A procedure that creates an image of a blood vessel and nearby organs and provides information about blood flow through the blood vessel (duplex ultrasound). ? A procedure that tests blood flow (plethysmography). ? A procedure to look at the veins using X-ray and dye (venogram). How is this treated? The goals of treatment are to help you return to an active life and to minimize pain or disability. Treatment depends on the severity of your condition, and it may include:  Wearing compression stockings. These can help relieve symptoms and help prevent your condition from getting worse. However, they do not cure the condition.  Sclerotherapy. This is a procedure involving an injection of a material that "dissolves" damaged veins.  Surgery. This may involve: ? Removing a diseased vein (vein stripping). ? Cutting off blood flow through the vein (laser ablation surgery). ? Repairing a valve. Follow these instructions at home:      Wear compression stockings as told by your health care provider. These stockings help to prevent blood clots and reduce swelling in your legs.  Take over-the-counter and prescription medicines only as told by your health care provider.  Stay active by exercising, walking, or doing different activities. Ask your health care provider what activities are safe for you and how much exercise you need.  Drink enough fluid to keep your urine clear or pale yellow.  Do not use any products that contain nicotine or tobacco, such as cigarettes and  e-cigarettes. If you need help quitting, ask your health care provider.  Keep all follow-up visits as told by your health care provider. This is important. Contact a health care provider if:  You have redness, swelling, or more pain in the affected area.  You see a red streak or line that extends up or down from  the affected area.  You have skin breakdown or a loss of skin in the affected area, even if the breakdown is small.  You get an injury in the affected area. Get help right away if:  You get an injury and an open wound in the affected area.  You have severe pain that does not get better with medicine.  You have sudden numbness or weakness in the foot or ankle below the affected area, or you have trouble moving your foot or ankle.  You have a fever and you have worse or persistent symptoms.  You have chest pain.  You have shortness of breath. Summary  Chronic venous insufficiency, also called venous stasis, is a condition that prevents blood from being pumped effectively through the veins in your legs.  Chronic venous insufficiency occurs when the vein walls become stretched, weakened, or damaged, or when valves within the vein are damaged.  Treatment for this condition depends on how severe your condition is, and it may involve wearing compression stockings or having a procedure.  Make sure you stay active by exercising, walking, or doing different activities. Ask your health care provider what activities are safe for you and how much exercise you need. This information is not intended to replace advice given to you by your health care provider. Make sure you discuss any questions you have with your health care provider. Document Released: 06/30/2006 Document Revised: 01/14/2016 Document Reviewed: 01/14/2016 Elsevier Interactive Patient Education  2019 [employer].

## 2018-05-27 NOTE — Progress Notes (Signed)
Patient: Samuel Jones Male    DOB: 1933-05-18   85 y.o.   MRN: [id number] Visit Date: 05/27/2018  Today's Provider: Lavon Paganini, MD   Chief Complaint  Patient presents with  . Face to face for oxygen recertification  . Rash   Subjective:    I, Samuel Jones, CMA, am acting as a Education administrator for Samuel Paganini, MD.    Rash  This is a new problem. Episode onset: couple weeka ago. The problem has been gradually worsening since onset. The affected locations include the right upper leg and left ankle. The rash is characterized by redness. Past treatments include nothing.   He was previously using oxygen qhs.  Not sure why.  No lung disease.  Doesn't feel any different if he does not use it.  With exertion in clinic, O2 stays at 94% on RA.  No Known Allergies   Current Outpatient Medications:  .  acetaminophen (TYLENOL) 325 MG tablet, Take 650 mg by mouth every 6 (six) hours as needed., Disp: , Rfl:  .  allopurinol (ZYLOPRIM) 300 MG tablet, TAKE 1 TABLET BY MOUTH EVERY DAY, Disp: 90 tablet, Rfl: 1 .  aspirin EC 81 MG tablet, Take 81 mg by mouth daily., Disp: , Rfl:  .  atenolol (TENORMIN) 25 MG tablet, TAKE 1 TABLET BY MOUTH EVERY DAY, Disp: 90 tablet, Rfl: 1 .  clobetasol (TEMOVATE) 0.05 % external solution, Apply 1 application topically 2 (two) times daily., Disp: , Rfl:  .  colchicine 0.6 MG tablet, TAKE 1 TABLET BY MOUTH EVERY DAY, Disp: 90 tablet, Rfl: 1 .  donepezil (ARICEPT) 10 MG tablet, TAKE 1 TABLET BY MOUTH EVERY DAY IN THE EVENING, Disp: 90 tablet, Rfl: 3 .  fluticasone (FLONASE) 50 MCG/ACT nasal spray, Place into both nostrils daily., Disp: , Rfl:  .  ketoconazole (NIZORAL) 2 % shampoo, Apply 1 application topically 2 (two) times a week., Disp: , Rfl:  .  levothyroxine (SYNTHROID, LEVOTHROID) 100 MCG tablet, Take 1 tablet (100 mcg total) by mouth daily before breakfast., Disp: 90 tablet, Rfl: 3 .  Multiple Vitamin (MULTIVITAMIN) capsule, Take 1 capsule by mouth  daily., Disp: , Rfl:  .  Multiple Vitamins-Minerals (PRESERVISION AREDS PO), Take by mouth., Disp: , Rfl:  .  NIFEdipine (ADALAT CC) 30 MG 24 hr tablet, Take 2 tablets (60 mg total) by mouth daily., Disp: 180 tablet, Rfl: 3 .  nystatin-triamcinolone (MYCOLOG II) cream, Apply 1 application topically 2 (two) times daily., Disp: , Rfl:  .  Omega-3 Fatty Acids (FISH OIL) 1000 MG CAPS, Take 3 capsules by mouth daily., Disp: , Rfl:  .  OXYGEN, Inhale into the lungs. 4 Liters O2 nightly, Disp: , Rfl:   Review of Systems  Constitutional: Negative.   Respiratory: Negative.   Cardiovascular: Negative.   Musculoskeletal: Positive for arthralgias.  Skin: Positive for rash.  Psychiatric/Behavioral: Negative.     Social History   Tobacco Use  . Smoking status: Former Smoker    Types: Cigarettes    Last attempt to quit: 03/09/1974    Years since quitting: 44.2  . Smokeless tobacco: Never Used  Substance Use Topics  . Alcohol use: Never    Frequency: Never      Objective:   BP (!) 144/68 (BP Location: Left Arm, Patient Position: Sitting, Cuff Size: Large)   Pulse (!) 54   Temp 98 F (36.7 C) (Oral)   SpO2 92%  Vitals:   05/27/18 1418  BP: (!) 144/68  Pulse: (!) 54  Temp: 98 F (36.7 C)  TempSrc: Oral  SpO2: 92%     Physical Exam Vitals signs reviewed.  Constitutional:      General: He is not in acute distress.    Appearance: Normal appearance. He is not diaphoretic.  HENT:     Head: Normocephalic and atraumatic.  Eyes:     General: No scleral icterus.    Conjunctiva/sclera: Conjunctivae normal.  Cardiovascular:     Rate and Rhythm: Normal rate and regular rhythm.  Pulmonary:     Effort: Pulmonary effort is normal. No respiratory distress.     Breath sounds: Normal breath sounds. No wheezing or rhonchi.  Musculoskeletal:     Right lower leg: Edema (brace in place for post-polio syndrome) present.     Left lower leg: Edema present.  Skin:    General: Skin is warm and  dry.     Capillary Refill: Capillary refill takes less than 2 seconds.     Comments: 5cm lesion of back of R thigh.  Raised and scaling edges, erythematous, with some central clearing.  Erythematous maculo-papular rash on LLE around ankle with chronic skin color changes  Neurological:     Mental Status: He is alert.  Psychiatric:        Mood and Affect: Mood normal.        Behavior: Behavior normal.        Assessment & Plan   1. Tinea corporis -Rash appears to be fungal in nature with its raised scaling edges and central clearing -Patient does have history of skin cancer and so he is worried about this, but this is a rather large area -Will treat with clotrimazole twice daily -If does not resolve, advised to follow-up with his dermatologist  2. Venous stasis dermatitis of left lower extremity - Chronic but worsening -Advised on compression stockings -Given the rash that is now apparent, can use triamcinolone ointment twice daily until resolution -Advised on keeping feet elevated as well  3. History of skin cancer -As above, advised him that if the rash does not resolve, he should follow-up with his dermatologist - Does not have concerning features at this time    Meds ordered this encounter  Medications  . clotrimazole (LOTRIMIN) 1 % cream    Sig: Apply 1 application topically 2 (two) times daily. To rash on R thigh    Dispense:  60 g    Refill:  0  . triamcinolone ointment (KENALOG) 0.5 %    Sig: Apply 1 application topically 2 (two) times daily. For rash on L ankle    Dispense:  60 g    Refill:  1     Return if symptoms worsen or fail to improve.   The entirety of the information documented in the History of Present Illness, Review of Systems and Physical Exam were personally obtained by me. Portions of this information were initially documented by Samuel Jones, CMA and reviewed by me for thoroughness and accuracy.    Samuel Crews, MD, MPH [facility] 05/27/2018 3:19 PM

## 2018-06-11 ENCOUNTER — Telehealth: Payer: Self-pay

## 2018-06-11 NOTE — Telephone Encounter (Signed)
Ok to verbal for d/c o2 therapy

## 2018-06-11 NOTE — Telephone Encounter (Signed)
Jameek with [facility] is requesting a order to D/C patient's home O2 or re-certification and notes from [facility] on 3/19 if patient is going to continue receiving O2. Fax # [phone]

## 2018-06-14 NOTE — Telephone Encounter (Signed)
D/C order will be faxed to [facility] on Wednesday after Dr. Brita Romp signs the order.

## 2018-06-25 ENCOUNTER — Encounter: Payer: Self-pay | Admitting: Family Medicine

## 2018-07-07 ENCOUNTER — Other Ambulatory Visit: Payer: Self-pay | Admitting: Family Medicine

## 2018-08-26 ENCOUNTER — Ambulatory Visit (INDEPENDENT_AMBULATORY_CARE_PROVIDER_SITE_OTHER): Payer: Medicare Other | Admitting: Family Medicine

## 2018-08-26 ENCOUNTER — Encounter: Payer: Self-pay | Admitting: Family Medicine

## 2018-08-26 ENCOUNTER — Other Ambulatory Visit: Payer: Self-pay

## 2018-08-26 VITALS — BP 134/62 | HR 72 | Temp 98.4°F | Resp 20 | Wt 220.0 lb

## 2018-08-26 DIAGNOSIS — R4189 Other symptoms and signs involving cognitive functions and awareness: Secondary | ICD-10-CM | POA: Diagnosis not present

## 2018-08-26 DIAGNOSIS — I10 Essential (primary) hypertension: Secondary | ICD-10-CM

## 2018-08-26 DIAGNOSIS — N3001 Acute cystitis with hematuria: Secondary | ICD-10-CM | POA: Diagnosis not present

## 2018-08-26 DIAGNOSIS — Z85820 Personal history of malignant melanoma of skin: Secondary | ICD-10-CM | POA: Diagnosis not present

## 2018-08-26 DIAGNOSIS — G14 Postpolio syndrome: Secondary | ICD-10-CM | POA: Diagnosis not present

## 2018-08-26 DIAGNOSIS — H9193 Unspecified hearing loss, bilateral: Secondary | ICD-10-CM | POA: Diagnosis not present

## 2018-08-26 LAB — POCT URINALYSIS DIPSTICK
Bilirubin, UA: NEGATIVE
Glucose, UA: NEGATIVE
Ketones, UA: NEGATIVE
Nitrite, UA: POSITIVE
Protein, UA: POSITIVE — AB
Spec Grav, UA: 1.025 (ref 1.010–1.025)
Urobilinogen, UA: 0.2 E.U./dL
pH, UA: 6 (ref 5.0–8.0)

## 2018-08-26 MED ORDER — SULFAMETHOXAZOLE-TRIMETHOPRIM 800-160 MG PO TABS: 1.0000 | ORAL_TABLET | Freq: Two times a day (BID) | ORAL | 1 refills | Status: AC

## 2018-08-26 NOTE — Progress Notes (Signed)
Patient: Samuel Jones Male    DOB: 02-Oct-1933   85 y.o.   MRN: [id number] Visit Date: 08/26/2018  Today's Provider: Wilhemena Durie, MD   Chief Complaint  Patient presents with  . Urinary Tract Infection   Subjective:    Urinary Tract Infection  This is a new problem. The current episode started in the past 7 days (about 2 days). The problem occurs every urination. The problem has been gradually worsening. The quality of the pain is described as burning. The pain is mild. There has been no fever. Associated symptoms include frequency and urgency. Pertinent negatives include no chills, flank pain, hematuria, hesitancy, nausea or vomiting. He has tried nothing for the symptoms.  he does have urgency  No Known Allergies   Current Outpatient Medications:  .  acetaminophen (TYLENOL) 325 MG tablet, Take 650 mg by mouth every 6 (six) hours as needed., Disp: , Rfl:  .  allopurinol (ZYLOPRIM) 300 MG tablet, TAKE 1 TABLET BY MOUTH EVERY DAY, Disp: 90 tablet, Rfl: 1 .  aspirin EC 81 MG tablet, Take 81 mg by mouth daily., Disp: , Rfl:  .  atenolol (TENORMIN) 25 MG tablet, TAKE 1 TABLET BY MOUTH EVERY DAY, Disp: 90 tablet, Rfl: 1 .  clobetasol (TEMOVATE) 0.05 % external solution, Apply 1 application topically 2 (two) times daily., Disp: , Rfl:  .  clotrimazole (LOTRIMIN) 1 % cream, Apply 1 application topically 2 (two) times daily. To rash on R thigh, Disp: 60 g, Rfl: 0 .  colchicine 0.6 MG tablet, TAKE 1 TABLET BY MOUTH EVERY DAY, Disp: 90 tablet, Rfl: 1 .  donepezil (ARICEPT) 10 MG tablet, TAKE 1 TABLET BY MOUTH EVERY DAY IN THE EVENING, Disp: 90 tablet, Rfl: 3 .  fluticasone (FLONASE) 50 MCG/ACT nasal spray, Place into both nostrils daily., Disp: , Rfl:  .  ketoconazole (NIZORAL) 2 % shampoo, Apply 1 application topically 2 (two) times a week., Disp: , Rfl:  .  levothyroxine (SYNTHROID, LEVOTHROID) 100 MCG tablet, Take 1 tablet (100 mcg total) by mouth daily before breakfast., Disp:  90 tablet, Rfl: 3 .  Multiple Vitamin (MULTIVITAMIN) capsule, Take 1 capsule by mouth daily., Disp: , Rfl:  .  Multiple Vitamins-Minerals (PRESERVISION AREDS PO), Take by mouth., Disp: , Rfl:  .  NIFEdipine (ADALAT CC) 30 MG 24 hr tablet, Take 2 tablets (60 mg total) by mouth daily., Disp: 180 tablet, Rfl: 3 .  nystatin-triamcinolone (MYCOLOG II) cream, Apply 1 application topically 2 (two) times daily., Disp: , Rfl:  .  Omega-3 Fatty Acids (FISH OIL) 1000 MG CAPS, Take 3 capsules by mouth daily., Disp: , Rfl:  .  OXYGEN, Inhale into the lungs. 4 Liters O2 nightly, Disp: , Rfl:  .  triamcinolone ointment (KENALOG) 0.5 %, Apply 1 application topically 2 (two) times daily. For rash on L ankle, Disp: 60 g, Rfl: 1  Review of Systems  Constitutional: Negative for chills and fever.  HENT: Negative.   Eyes: Negative.   Respiratory: Negative.   Cardiovascular: Negative.   Gastrointestinal: Negative.  Negative for nausea and vomiting.  Endocrine: Negative.   Genitourinary: Positive for frequency and urgency. Negative for flank pain, hematuria and hesitancy.  Allergic/Immunologic: Negative.   Hematological: Negative.   Psychiatric/Behavioral: Negative.     Social History   Tobacco Use  . Smoking status: Former Smoker    Types: Cigarettes    Quit date: 03/09/1974    Years since quitting: 44.4  . Smokeless tobacco:  Never Used  Substance Use Topics  . Alcohol use: Never    Frequency: Never      Objective:   BP 134/62   Pulse 72   Temp 98.4 F (36.9 C)   Resp 20   Wt 220 lb (99.8 kg)   SpO2 96%   BMI 29.84 kg/m  Vitals:   08/26/18 1355  BP: 134/62  Pulse: 72  Resp: 20  Temp: 98.4 F (36.9 C)  SpO2: 96%  Weight: 220 lb (99.8 kg)     Physical Exam Vitals signs reviewed.  Constitutional:      Appearance: Normal appearance.     Comments: E.lderly WM NAD.  HENT:     Head: Normocephalic and atraumatic.     Right Ear: External ear normal.     Left Ear: External ear  normal.     Nose: Nose normal.  Eyes:     General: No scleral icterus. Cardiovascular:     Rate and Rhythm: Normal rate and regular rhythm.     Heart sounds: Normal heart sounds.  Pulmonary:     Effort: Pulmonary effort is normal.     Breath sounds: Normal breath sounds.  Abdominal:     Palpations: Abdomen is soft.     Comments: No CVAT.  Skin:    General: Skin is warm and dry.  Neurological:     General: No focal deficit present.     Mental Status: He is alert and oriented to person, place, and time.  Psychiatric:        Mood and Affect: Mood normal.        Behavior: Behavior normal.        Thought Content: Thought content normal.        Judgment: Judgment normal.         Assessment & Plan    1. Acute cystitis with hematuria Rx with Septra for 10 days.Push fluids,try cranberry juice daily. - Urine Culture - POCT urinalysis dipstick - sulfamethoxazole-trimethoprim (BACTRIM DS) 800-160 MG tablet; Take 1 tablet by mouth 2 (two) times daily for 10 days.  Dispense: 20 tablet; Refill: 1  2. Essential hypertension   3. Post-polio syndrome   4. Bilateral hearing loss, unspecified hearing loss type   5. History of melanoma   6. Cognitive impairment     I have done the exam and reviewed the chart and it is accurate to the best of my knowledge. Development worker, community has been used and  any errors in dictation or transcription are unintentional. Miguel Aschoff M.D. Sm[place], MD  [facility]

## 2018-08-28 LAB — URINE CULTURE

## 2018-08-30 ENCOUNTER — Telehealth: Payer: Self-pay

## 2018-08-30 NOTE — Telephone Encounter (Signed)
-----   Message from Jerrol Banana., MD sent at 08/30/2018  8:36 AM EDT ----- Patient should be starting to feel better on septra.

## 2018-08-30 NOTE — Telephone Encounter (Signed)
Patient was advised.  

## 2018-09-22 ENCOUNTER — Ambulatory Visit: Payer: Self-pay | Admitting: Family Medicine

## 2018-10-08 ENCOUNTER — Telehealth: Payer: Self-pay | Admitting: Family Medicine

## 2018-10-08 NOTE — Telephone Encounter (Signed)
Step Son - Tamala Julian [phone]  Calling to get pt's list of medications currently taking for a supplemental insurance. Legrand Como is not on DPR. Will be faxing the medical power of attorney of pt.  Thanks, [employer]

## 2018-10-11 NOTE — Telephone Encounter (Signed)
Did we talk to someone on the DPR and get this sent out?

## 2018-10-12 ENCOUNTER — Telehealth: Payer: Self-pay

## 2018-10-12 NOTE — Telephone Encounter (Signed)
Patient's wife had her home health nurse Kim @ [facility] to explain that the patient is having redness in lower left leg, 2 large blisters, swollen, warm and painful to the touch. Pain level is 8 out of 10. This started on October 08, 2018. He has not taken anything for the pain. He is able to stand and walk on the leg without difficulty. There is also 3 plus edema seen. Advised patient to go to ER if pain is unable to be controlled, wife states they do not have any transportation, and he does not want to go to the ER. Virtual appointment scheduled for tomorrow afternoon.

## 2018-10-12 NOTE — Progress Notes (Signed)
Patient: Samuel Jones Male    DOB: 1933-11-13   85 y.o.   MRN: [id number] Visit Date: 10/12/2018  Today's Provider: Mar Daring, PA-C   No chief complaint on file.  Subjective:    Virtual Visit via Telephone Note  I connected with [name] on 10/13/18 at  2:00 PM EDT by telephone and verified that I am speaking with the correct person using two identifiers.  Location: Patient: Home Provider: [facility]   I discussed the limitations, risks, security and privacy concerns of performing an evaluation and management service by telephone and the availability of in person appointments. I also discussed with the patient that there may be a patient responsible charge related to this service. The patient expressed understanding and agreed to proceed.  Mar Daring, PA-C   HPI Patient c/o cellulitis on left leg.The nurse Kim @ [facility] to explained patient was having redness in lower left leg, 2 large blisters, swelling, warm and painful to the touch. Pain level is 8 out of 10. This started on October 08, 2018. He has not taken anything for the pain. He is able to stand and walk on the leg without difficulty. There is also 3 plus edema seen. Patient was advised to go to ER if pain is unable to be controlled, wife stated they did not have any transportation, and patient didn't want go to the ER.  No Known Allergies   Current Outpatient Medications:  .  acetaminophen (TYLENOL) 325 MG tablet, Take 650 mg by mouth every 6 (six) hours as needed., Disp: , Rfl:  .  allopurinol (ZYLOPRIM) 300 MG tablet, TAKE 1 TABLET BY MOUTH EVERY DAY, Disp: 90 tablet, Rfl: 1 .  aspirin EC 81 MG tablet, Take 81 mg by mouth daily., Disp: , Rfl:  .  atenolol (TENORMIN) 25 MG tablet, TAKE 1 TABLET BY MOUTH EVERY DAY, Disp: 90 tablet, Rfl: 1 .  clobetasol (TEMOVATE) 0.05 % external solution, Apply 1 application topically 2 (two) times daily., Disp: , Rfl:  .  clotrimazole (LOTRIMIN) 1 %  cream, Apply 1 application topically 2 (two) times daily. To rash on R thigh, Disp: 60 g, Rfl: 0 .  colchicine 0.6 MG tablet, TAKE 1 TABLET BY MOUTH EVERY DAY, Disp: 90 tablet, Rfl: 1 .  donepezil (ARICEPT) 10 MG tablet, TAKE 1 TABLET BY MOUTH EVERY DAY IN THE EVENING, Disp: 90 tablet, Rfl: 3 .  fluticasone (FLONASE) 50 MCG/ACT nasal spray, Place into both nostrils daily., Disp: , Rfl:  .  ketoconazole (NIZORAL) 2 % shampoo, Apply 1 application topically 2 (two) times a week., Disp: , Rfl:  .  levothyroxine (SYNTHROID, LEVOTHROID) 100 MCG tablet, Take 1 tablet (100 mcg total) by mouth daily before breakfast., Disp: 90 tablet, Rfl: 3 .  Multiple Vitamin (MULTIVITAMIN) capsule, Take 1 capsule by mouth daily., Disp: , Rfl:  .  Multiple Vitamins-Minerals (PRESERVISION AREDS PO), Take by mouth., Disp: , Rfl:  .  NIFEdipine (ADALAT CC) 30 MG 24 hr tablet, Take 2 tablets (60 mg total) by mouth daily., Disp: 180 tablet, Rfl: 3 .  nystatin-triamcinolone (MYCOLOG II) cream, Apply 1 application topically 2 (two) times daily., Disp: , Rfl:  .  Omega-3 Fatty Acids (FISH OIL) 1000 MG CAPS, Take 3 capsules by mouth daily., Disp: , Rfl:  .  OXYGEN, Inhale into the lungs. 4 Liters O2 nightly, Disp: , Rfl:  .  triamcinolone ointment (KENALOG) 0.5 %, Apply 1 application topically 2 (two)  times daily. For rash on L ankle, Disp: 60 g, Rfl: 1  Review of Systems  Constitutional: Negative.   Respiratory: Negative.   Cardiovascular: Positive for leg swelling.  Skin: Positive for color change and wound.  Neurological: Positive for weakness.    Social History   Tobacco Use  . Smoking status: Former Smoker    Types: Cigarettes    Quit date: 03/09/1974    Years since quitting: 44.6  . Smokeless tobacco: Never Used  Substance Use Topics  . Alcohol use: Never    Frequency: Never      Objective:   There were no vitals taken for this visit. There were no vitals filed for this visit.   Physical Exam Vitals  signs reviewed.  Constitutional:      General: He is not in acute distress.    Appearance: Normal appearance. He is well-developed. He is not ill-appearing.  HENT:     Head: Normocephalic and atraumatic.  Eyes:     Conjunctiva/sclera: Conjunctivae normal.  Neck:     Musculoskeletal: Normal range of motion and neck supple.  Pulmonary:     Effort: Pulmonary effort is normal. No respiratory distress.  Skin:    Comments: On video the leg is 2-3 times bigger than his right. It is red from the mid foot to just below the knee, there is multiple large blisters just distal to the medial malleolus. Swelling in the foot all the way to the toes noted, but then stops before going into toes  Neurological:     Mental Status: He is alert.  Psychiatric:        Mood and Affect: Mood normal.        Behavior: Behavior normal.        Thought Content: Thought content normal.        Judgment: Judgment normal.      No results found for any visits on 10/13/18.     Assessment & Plan     1. Cellulitis of left lower extremity Very concerning for possible DVT or even ischemia. I did advise that I am very concerned and they still decline to go to the ER. I have ordered an US of the left lower extremity and also prescribed doxycycline for infection. Advised to call if worsening before the [place] is resulted. - doxycycline (VIBRA-TABS) 100 MG tablet; Take 1 tablet (100 mg total) by mouth 2 (two) times daily.  Dispense: 20 tablet; Refill: 0 - US Venous Img Lower Unilateral Left; Future  2. Swelling of left lower extremity See above medical treatment plan. - US Venous Img Lower Unilateral Left; Future  3. Discoloration of skin of lower leg See above medical treatment plan. - US Venous Img Lower Unilateral Left; Future    I discussed the assessment and treatment plan with the patient. The patient was provided an opportunity to ask questions and all were answered. The patient agreed with the plan and  demonstrated an understanding of the instructions.   The patient was advised to call back or seek an in-person evaluation if the symptoms worsen or if the condition fails to improve as anticipated.  I provided 16 minutes of non-face-to-face time during this encounter.    Mar Daring, PA-C  [facility]

## 2018-10-13 ENCOUNTER — Ambulatory Visit (INDEPENDENT_AMBULATORY_CARE_PROVIDER_SITE_OTHER): Payer: Medicare Other | Admitting: Physician Assistant

## 2018-10-13 ENCOUNTER — Encounter: Payer: Self-pay | Admitting: Physician Assistant

## 2018-10-13 DIAGNOSIS — M7989 Other specified soft tissue disorders: Secondary | ICD-10-CM

## 2018-10-13 DIAGNOSIS — L03116 Cellulitis of left lower limb: Secondary | ICD-10-CM | POA: Diagnosis not present

## 2018-10-13 DIAGNOSIS — L819 Disorder of pigmentation, unspecified: Secondary | ICD-10-CM

## 2018-10-13 MED ORDER — DOXYCYCLINE HYCLATE 100 MG PO TABS: 100.0000 mg | ORAL_TABLET | Freq: Two times a day (BID) | ORAL | 0 refills | Status: DC

## 2018-10-15 ENCOUNTER — Telehealth: Payer: Self-pay | Admitting: Family Medicine

## 2018-10-15 NOTE — Telephone Encounter (Signed)
[name] do up the update on this patient Samuel Jones.

## 2018-10-15 NOTE — Telephone Encounter (Signed)
It is scheduled 10/18/18 at 3:30 at [facility]

## 2018-10-15 NOTE — Telephone Encounter (Signed)
Pt's friend Louanne Belton calling back - Pt needing to know if this has been done for the referral due to Dr. B wanting the ultra sound to be done for the pt Thus or Fri.  Thanks, [employer]

## 2018-10-15 NOTE — Telephone Encounter (Signed)
[facility] -  Leandra Kern [phone] Naomi/Verda (wife to pt)- Gives Kami permission to help them with scheduling and helping pt.  [facility] has not called them to schedule the Ultra Sound pt is needing per Jenni's visit on Wed.  Please call them back to let them know.  Thanks, [employer]

## 2018-10-18 ENCOUNTER — Other Ambulatory Visit: Payer: Self-pay

## 2018-10-18 ENCOUNTER — Ambulatory Visit
Admission: RE | Admit: 2018-10-18 | Discharge: 2018-10-18 | Disposition: A | Discharge status: Home / Self Care | Payer: Medicare Other | Source: Ambulatory Visit | Attending: Physician Assistant | Admitting: Physician Assistant

## 2018-10-18 DIAGNOSIS — L819 Disorder of pigmentation, unspecified: Secondary | ICD-10-CM | POA: Diagnosis not present

## 2018-10-18 DIAGNOSIS — M7989 Other specified soft tissue disorders: Secondary | ICD-10-CM | POA: Diagnosis not present

## 2018-10-18 DIAGNOSIS — R600 Localized edema: Secondary | ICD-10-CM | POA: Diagnosis not present

## 2018-10-18 DIAGNOSIS — L03116 Cellulitis of left lower limb: Secondary | ICD-10-CM

## 2018-10-19 ENCOUNTER — Telehealth: Payer: Self-pay

## 2018-10-19 NOTE — Telephone Encounter (Signed)
LMTCB on cell number.

## 2018-10-19 NOTE — Telephone Encounter (Signed)
Spoke with patient's son Randall Hiss) and he states that he came up last week to pick up medication list for the patient insurance.

## 2018-10-19 NOTE — Telephone Encounter (Signed)
-----   Message from Mar Daring, PA-C sent at 10/19/2018  1:28 PM EDT ----- No DVT noted. How is the swelling doing? Are the wounds improving?

## 2018-10-19 NOTE — Telephone Encounter (Signed)
Patient's son Randall Hiss) was advised.

## 2018-10-20 NOTE — Telephone Encounter (Signed)
Samuel Jones wife of pt wanting a call back to be told of results TODAY.  Pt's wife is upset because they were not contacted and do not know any results of blood clot in pt's leg.  Please call Samuel Jones back today at [phone].  Thanks, [employer]

## 2018-10-20 NOTE — Telephone Encounter (Signed)
LMTCB

## 2018-10-21 ENCOUNTER — Other Ambulatory Visit: Payer: Self-pay

## 2018-10-21 DIAGNOSIS — M79662 Pain in left lower leg: Secondary | ICD-10-CM

## 2018-10-21 DIAGNOSIS — L03116 Cellulitis of left lower limb: Secondary | ICD-10-CM

## 2018-10-21 NOTE — Telephone Encounter (Signed)
Patient's wife notified of results. She states that the leg is still discolored but not as red. Minimal pain. Has a day and half left on antibiotics. She no longer wants Randall Hiss or Norine notified of any more of the results. She would like someone to call her back with further instructions on her husband's leg. Please advice

## 2018-10-21 NOTE — Telephone Encounter (Signed)
Patient's wife is demanding a call back today. She is very upset. Please advise

## 2018-10-21 NOTE — Telephone Encounter (Signed)
I believe this is follow up from [place] for DVT. Will Forward.  We may need to have her fill out anohter DPR if she wants to remove [employer].

## 2018-10-22 MED ORDER — DOXYCYCLINE HYCLATE 100 MG PO TABS: 100.0000 mg | ORAL_TABLET | Freq: Two times a day (BID) | ORAL | 0 refills | Status: DC

## 2018-10-22 NOTE — Telephone Encounter (Signed)
Called back to talk to patient patient asked for me to speak with his wife Perley Jain, told Perley Jain as directed in previous message. [place] Nurse was there and patient wanted me to talk to the nurse so the nurse can explain to me how the patient's leg is. Nurse name is [place] and she reports that patient's left leg is swollen with some redness and that patient has a wound near his heel about a inch or two. She said it looks like it has a scab over it. Patient has two more pills left of the Doxycycline. Patient's wife and Nurse (on speaker)since patient and wife wanted me to tell the nurse were advised that another round of Doxycycline was going to be send to his pharmacy and a referral to the would clinic will be placed and in the meantime patient can follow-up in the office. Wife requested an appointment with Dr. Jacinto Reap on Monday. Patient scheduled with Dr. B at 10:20.

## 2018-10-22 NOTE — Telephone Encounter (Signed)
The only other thing I could recommend is to refer to vascular surgery for further evaluation and consideration of getting a pump for the left leg to help push swelling out.   Referral was placed.

## 2018-10-22 NOTE — Addendum Note (Signed)
Addended by: Mar Daring on: 10/22/2018 05:00 PM   Modules accepted: Orders

## 2018-10-22 NOTE — Addendum Note (Signed)
Addended by: Doristine Devoid on: 10/22/2018 04:41 PM   Modules accepted: Orders

## 2018-10-25 ENCOUNTER — Ambulatory Visit (INDEPENDENT_AMBULATORY_CARE_PROVIDER_SITE_OTHER): Payer: Medicare Other | Admitting: Family Medicine

## 2018-10-25 ENCOUNTER — Encounter: Payer: Self-pay | Admitting: Family Medicine

## 2018-10-25 VITALS — BP 122/68 | HR 78 | Temp 98.4°F | Resp 16 | Ht 72.0 in | Wt 227.0 lb

## 2018-10-25 DIAGNOSIS — M7989 Other specified soft tissue disorders: Secondary | ICD-10-CM

## 2018-10-25 DIAGNOSIS — L03116 Cellulitis of left lower limb: Secondary | ICD-10-CM | POA: Diagnosis not present

## 2018-10-25 MED ORDER — SULFAMETHOXAZOLE-TRIMETHOPRIM 800-160 MG PO TABS: 1.0000 | ORAL_TABLET | Freq: Two times a day (BID) | ORAL | 0 refills | Status: AC

## 2018-10-25 NOTE — Progress Notes (Signed)
Patient: Samuel Jones Male    DOB: [birth year]   85 y.o.   MRN: [id number] Visit Date: 10/25/2018  Today's Provider: Lavon Paganini, MD   Chief Complaint  Patient presents with  . Follow-up   Subjective:     HPI   Cellulitis of left lower extremity From 10/13/2018-US of the left lower extremity ordered-result was normal. Also prescribed doxycycline for infection.  Patient's left lower leg is still red and swollen. Wound on ankle and heel is running fluid. Wife reports that this is new.  No Known Allergies   Current Outpatient Medications:  .  acetaminophen (TYLENOL) 325 MG tablet, Take 650 mg by mouth every 6 (six) hours as needed., Disp: , Rfl:  .  allopurinol (ZYLOPRIM) 300 MG tablet, TAKE 1 TABLET BY MOUTH EVERY DAY, Disp: 90 tablet, Rfl: 1 .  aspirin EC 81 MG tablet, Take 81 mg by mouth daily., Disp: , Rfl:  .  atenolol (TENORMIN) 25 MG tablet, TAKE 1 TABLET BY MOUTH EVERY DAY, Disp: 90 tablet, Rfl: 1 .  clobetasol (TEMOVATE) 0.05 % external solution, Apply 1 application topically 2 (two) times daily., Disp: , Rfl:  .  clotrimazole (LOTRIMIN) 1 % cream, Apply 1 application topically 2 (two) times daily. To rash on R thigh, Disp: 60 g, Rfl: 0 .  colchicine 0.6 MG tablet, TAKE 1 TABLET BY MOUTH EVERY DAY, Disp: 90 tablet, Rfl: 1 .  donepezil (ARICEPT) 10 MG tablet, TAKE 1 TABLET BY MOUTH EVERY DAY IN THE EVENING, Disp: 90 tablet, Rfl: 3 .  doxycycline (VIBRA-TABS) 100 MG tablet, Take 1 tablet (100 mg total) by mouth 2 (two) times daily., Disp: 20 tablet, Rfl: 0 .  fluticasone (FLONASE) 50 MCG/ACT nasal spray, Place into both nostrils daily., Disp: , Rfl:  .  ketoconazole (NIZORAL) 2 % shampoo, Apply 1 application topically 2 (two) times a week., Disp: , Rfl:  .  levothyroxine (SYNTHROID, LEVOTHROID) 100 MCG tablet, Take 1 tablet (100 mcg total) by mouth daily before breakfast., Disp: 90 tablet, Rfl: 3 .  Multiple Vitamin (MULTIVITAMIN) capsule, Take 1 capsule by mouth  daily., Disp: , Rfl:  .  Multiple Vitamins-Minerals (PRESERVISION AREDS PO), Take by mouth., Disp: , Rfl:  .  NIFEdipine (ADALAT CC) 30 MG 24 hr tablet, Take 2 tablets (60 mg total) by mouth daily., Disp: 180 tablet, Rfl: 3 .  nystatin-triamcinolone (MYCOLOG II) cream, Apply 1 application topically 2 (two) times daily., Disp: , Rfl:  .  Omega-3 Fatty Acids (FISH OIL) 1000 MG CAPS, Take 3 capsules by mouth daily., Disp: , Rfl:  .  OXYGEN, Inhale into the lungs. 4 Liters O2 nightly, Disp: , Rfl:  .  triamcinolone ointment (KENALOG) 0.5 %, Apply 1 application topically 2 (two) times daily. For rash on L ankle, Disp: 60 g, Rfl: 1  Review of Systems  Constitutional: Negative for appetite change, chills and fever.  Respiratory: Negative for chest tightness, shortness of breath and wheezing.   Cardiovascular: Negative for chest pain and palpitations.  Gastrointestinal: Negative for abdominal pain, nausea and vomiting.    Social History   Tobacco Use  . Smoking status: Former Smoker    Types: Cigarettes    Quit date: 03/09/1974    Years since quitting: 44.6  . Smokeless tobacco: Never Used  Substance Use Topics  . Alcohol use: Never    Frequency: Never      Objective:   BP 122/68 (BP Location: Left Arm, Patient Position: Sitting, Cuff Size:  Large)   Pulse 78   Temp 98.4 F (36.9 C) (Oral)   Resp 16   Ht 6' (1.829 m)   Wt 227 lb (103 kg)   SpO2 95%   BMI 30.79 kg/m  Vitals:   10/25/18 1024  BP: 122/68  Pulse: 78  Resp: 16  Temp: 98.4 F (36.9 C)  TempSrc: Oral  SpO2: 95%  Weight: 227 lb (103 kg)  Height: 6' (1.829 m)     Physical Exam Vitals signs reviewed.  Constitutional:      General: He is not in acute distress.    Appearance: Normal appearance.  HENT:     Head: Normocephalic and atraumatic.  Cardiovascular:     Rate and Rhythm: Normal rate and regular rhythm.     Comments: Due to swelling of LLE, difficult to palpate DP/PT pulses Pulmonary:     Effort:  Pulmonary effort is normal. No respiratory distress.  Musculoskeletal:     Right lower leg: No edema.     Left lower leg: Edema (2+) present.  Skin:    General: Skin is warm and dry.     Capillary Refill: Capillary refill takes less than 2 seconds.     Comments: See photos of LLE. Wounds on medial leg. Erythema and warmth almost to knee. +TTP  Neurological:     Mental Status: He is alert and oriented to person, place, and time. Mental status is at baseline.            No results found for any visits on 10/25/18.     Assessment & Plan   1. Cellulitis of left lower extremity 2. Swelling of left lower extremity - ongoing problem - DVT ruled out - could still have some arterial insufficiency, but reassured by warmth and Cap refill - referral to [facility] was placed at last visit and encouraged him to f/u with them - concern that cellulitis is not improving, so will d/c Doxy and start Bactrim x10d - f/u in 4 days to ensure some improvement - encouraged elevation - referral to [facility] RN for wound care - may benefit from lymphedema pumps - Ambulatory referral to [place] ordered this encounter  Medications  . sulfamethoxazole-trimethoprim (BACTRIM DS) 800-160 MG tablet    Sig: Take 1 tablet by mouth 2 (two) times daily for 10 days.    Dispense:  20 tablet    Refill:  0     Return in about 4 days (around 10/29/2018) for cellulitis f/u.   The entirety of the information documented in the History of Present Illness, Review of Systems and Physical Exam were personally obtained by me. Portions of this information were initially documented by Boe Deans and [place] and reviewed by me for thoroughness and accuracy.    Bacigalupo, Dionne Bucy, MD MPH [facility]

## 2018-10-25 NOTE — Patient Instructions (Signed)

## 2018-10-27 ENCOUNTER — Other Ambulatory Visit: Payer: Self-pay | Admitting: Physician Assistant

## 2018-10-27 ENCOUNTER — Telehealth: Payer: Self-pay

## 2018-10-27 DIAGNOSIS — Z9981 Dependence on supplemental oxygen: Secondary | ICD-10-CM | POA: Diagnosis not present

## 2018-10-27 DIAGNOSIS — L03116 Cellulitis of left lower limb: Secondary | ICD-10-CM | POA: Diagnosis not present

## 2018-10-27 DIAGNOSIS — L97322 Non-pressure chronic ulcer of left ankle with fat layer exposed: Secondary | ICD-10-CM | POA: Diagnosis not present

## 2018-10-27 DIAGNOSIS — I10 Essential (primary) hypertension: Secondary | ICD-10-CM | POA: Diagnosis not present

## 2018-10-27 DIAGNOSIS — G14 Postpolio syndrome: Secondary | ICD-10-CM | POA: Diagnosis not present

## 2018-10-27 DIAGNOSIS — Z9181 History of falling: Secondary | ICD-10-CM | POA: Diagnosis not present

## 2018-10-27 DIAGNOSIS — I872 Venous insufficiency (chronic) (peripheral): Secondary | ICD-10-CM | POA: Diagnosis not present

## 2018-10-27 NOTE — Telephone Encounter (Signed)
Pt's wife called to report he started to run a fever of 101.5 this afternoon.  The home health nurse was out today to change is bandage for his cellulitis.  He denies any other symptoms. I advised Mrs Dubie that with his fever of 101.5 he would need to be evaluated at the ER.  She agreed and will get his son to take him.    Thanks,   -Mickel Baas

## 2018-10-29 ENCOUNTER — Ambulatory Visit (INDEPENDENT_AMBULATORY_CARE_PROVIDER_SITE_OTHER): Payer: Medicare Other | Admitting: Family Medicine

## 2018-10-29 ENCOUNTER — Ambulatory Visit: Payer: Medicare Other | Admitting: Physician Assistant

## 2018-10-29 DIAGNOSIS — L03116 Cellulitis of left lower limb: Secondary | ICD-10-CM

## 2018-10-29 NOTE — Progress Notes (Signed)
Patient: Samuel Jones Male    DOB: [birth year]   85 y.o.   MRN: [id number] Visit Date: 10/29/2018  Today's Provider: Lavon Paganini, MD   Chief Complaint  Patient presents with  . Cellulitis    Left Lower Leg   Subjective:    I, Porsha McClurkin CMA, am acting as a scribe for Lavon Paganini, MD.   Virtual Visit via Video Note  I connected with [name] on 10/29/18 at  9:40 AM EDT by a video enabled telemedicine application and verified that I am speaking with the correct person using two identifiers.   Patient location: home Provider location: [place] involved in the visit: patient, provider, patient's wife, and family friend   I discussed the limitations of evaluation and management by telemedicine and the availability of in person appointments. The patient expressed understanding and agreed to proceed. HPI  Cellulitis Patient presents today virtually for left lower leg cellulitis follow-up. Patient last office visit was 10/25/2018. Patient has sores around his ankle per wife. Patient left lower leg is red (from knee down to foot), warm to touch, and only a little weight bearing. Patient's wife states that he has been having this problem for about 3 weeks now. Patient has appointment with the wound care center on 11/02/2018 @ 1:00 PM. Patient also has a home health nurse coming out on Monday,11/01/2018 per wife.   Redness seems to be improving since changing antibiotics. Had called 8/19 about a fever, but by the time the ambulance came to the home, he felt better and had no fever. They checked vital signs and did not take him to the hospital.   No Known Allergies   Current Outpatient Medications:  .  acetaminophen (TYLENOL) 325 MG tablet, Take 650 mg by mouth every 6 (six) hours as needed., Disp: , Rfl:  .  allopurinol (ZYLOPRIM) 300 MG tablet, TAKE 1 TABLET BY MOUTH EVERY DAY, Disp: 90 tablet, Rfl: 1 .  aspirin EC 81 MG tablet,  Take 81 mg by mouth daily., Disp: , Rfl:  .  atenolol (TENORMIN) 25 MG tablet, TAKE 1 TABLET BY MOUTH EVERY DAY, Disp: 90 tablet, Rfl: 1 .  clobetasol (TEMOVATE) 0.05 % external solution, Apply 1 application topically 2 (two) times daily., Disp: , Rfl:  .  clotrimazole (LOTRIMIN) 1 % cream, Apply 1 application topically 2 (two) times daily. To rash on R thigh, Disp: 60 g, Rfl: 0 .  colchicine 0.6 MG tablet, TAKE 1 TABLET BY MOUTH EVERY DAY, Disp: 90 tablet, Rfl: 1 .  donepezil (ARICEPT) 10 MG tablet, TAKE 1 TABLET BY MOUTH EVERY DAY IN THE EVENING, Disp: 90 tablet, Rfl: 3 .  fluticasone (FLONASE) 50 MCG/ACT nasal spray, Place into both nostrils daily., Disp: , Rfl:  .  ketoconazole (NIZORAL) 2 % shampoo, Apply 1 application topically 2 (two) times a week., Disp: , Rfl:  .  levothyroxine (SYNTHROID, LEVOTHROID) 100 MCG tablet, Take 1 tablet (100 mcg total) by mouth daily before breakfast., Disp: 90 tablet, Rfl: 3 .  Multiple Vitamin (MULTIVITAMIN) capsule, Take 1 capsule by mouth daily., Disp: , Rfl:  .  Multiple Vitamins-Minerals (PRESERVISION AREDS PO), Take by mouth., Disp: , Rfl:  .  NIFEdipine (ADALAT CC) 30 MG 24 hr tablet, Take 2 tablets (60 mg total) by mouth daily., Disp: 180 tablet, Rfl: 3 .  nystatin-triamcinolone (MYCOLOG II) cream, Apply 1 application topically 2 (two) times daily., Disp: , Rfl:  .  Omega-3 Fatty Acids (  FISH OIL) 1000 MG CAPS, Take 3 capsules by mouth daily., Disp: , Rfl:  .  OXYGEN, Inhale into the lungs. 4 Liters O2 nightly, Disp: , Rfl:  .  sulfamethoxazole-trimethoprim (BACTRIM DS) 800-160 MG tablet, Take 1 tablet by mouth 2 (two) times daily for 10 days., Disp: 20 tablet, Rfl: 0 .  triamcinolone ointment (KENALOG) 0.5 %, Apply 1 application topically 2 (two) times daily. For rash on L ankle, Disp: 60 g, Rfl: 1  Review of Systems  Constitutional: Negative.   Respiratory: Negative.   Cardiovascular: Positive for leg swelling.  Musculoskeletal: Negative.      Social History   Tobacco Use  . Smoking status: Former Smoker    Types: Cigarettes    Quit date: 03/09/1974    Years since quitting: 44.6  . Smokeless tobacco: Never Used  Substance Use Topics  . Alcohol use: Never    Frequency: Never      Objective:   There were no vitals taken for this visit. There were no vitals filed for this visit.   Physical Exam Constitutional:      Appearance: Normal appearance.  Skin:    Comments: LLE erythema significantly improved.  2 wounds on medial left ankle appeared to be stable Swelling appears decreased  Neurological:     Mental Status: He is alert.      No results found for any visits on 10/29/18.     Assessment & Plan    I discussed the assessment and treatment plan with the patient. The patient was provided an opportunity to ask questions and all were answered. The patient agreed with the plan and demonstrated an understanding of the instructions.   The patient was advised to call back or seek an in-person evaluation if the symptoms worsen or if the condition fails to improve as anticipated.  I provided 15 minutes of non-face-to-face time during this encounter.  1. Cellulitis of left lower extremity -Seems to be improving Continue Bactrim and finish course as prescribed - Has upcoming appointment with vascular surgery as well as wound care -We will continue wound care as advised by wound care physician with home health nurse - When his wife called 2 days ago reporting fever and change in appetite, there was concern for possible failure of oral antibiotic or sepsis, but he had normal vital signs when EMS arrived and he is well-appearing at this time -Discussed return precautions    Return if symptoms worsen or fail to improve.   The entirety of the information documented in the History of Present Illness, Review of Systems and Physical Exam were personally obtained by me. Portions of this information were initially documented  by [facility], CMA and reviewed by me for thoroughness and accuracy.    Basir Niven, Dionne Bucy, MD MPH [facility]

## 2018-11-02 ENCOUNTER — Other Ambulatory Visit: Payer: Self-pay

## 2018-11-02 ENCOUNTER — Encounter: Payer: Medicare Other | Admitting: Physician Assistant

## 2018-11-03 DIAGNOSIS — L03116 Cellulitis of left lower limb: Secondary | ICD-10-CM | POA: Diagnosis not present

## 2018-11-03 DIAGNOSIS — G14 Postpolio syndrome: Secondary | ICD-10-CM | POA: Diagnosis not present

## 2018-11-03 DIAGNOSIS — Z9181 History of falling: Secondary | ICD-10-CM | POA: Diagnosis not present

## 2018-11-03 DIAGNOSIS — I872 Venous insufficiency (chronic) (peripheral): Secondary | ICD-10-CM | POA: Diagnosis not present

## 2018-11-03 DIAGNOSIS — L97322 Non-pressure chronic ulcer of left ankle with fat layer exposed: Secondary | ICD-10-CM | POA: Diagnosis not present

## 2018-11-03 DIAGNOSIS — I10 Essential (primary) hypertension: Secondary | ICD-10-CM | POA: Diagnosis not present

## 2018-11-04 ENCOUNTER — Encounter (INDEPENDENT_AMBULATORY_CARE_PROVIDER_SITE_OTHER): Payer: Medicare Other | Admitting: Vascular Surgery

## 2018-11-08 DIAGNOSIS — L03116 Cellulitis of left lower limb: Secondary | ICD-10-CM | POA: Diagnosis not present

## 2018-11-08 DIAGNOSIS — I10 Essential (primary) hypertension: Secondary | ICD-10-CM | POA: Diagnosis not present

## 2018-11-08 DIAGNOSIS — Z9181 History of falling: Secondary | ICD-10-CM | POA: Diagnosis not present

## 2018-11-08 DIAGNOSIS — L97322 Non-pressure chronic ulcer of left ankle with fat layer exposed: Secondary | ICD-10-CM | POA: Diagnosis not present

## 2018-11-08 DIAGNOSIS — I872 Venous insufficiency (chronic) (peripheral): Secondary | ICD-10-CM | POA: Diagnosis not present

## 2018-11-08 DIAGNOSIS — G14 Postpolio syndrome: Secondary | ICD-10-CM | POA: Diagnosis not present

## 2018-11-11 DIAGNOSIS — G14 Postpolio syndrome: Secondary | ICD-10-CM | POA: Diagnosis not present

## 2018-11-11 DIAGNOSIS — I10 Essential (primary) hypertension: Secondary | ICD-10-CM | POA: Diagnosis not present

## 2018-11-11 DIAGNOSIS — Z9181 History of falling: Secondary | ICD-10-CM | POA: Diagnosis not present

## 2018-11-11 DIAGNOSIS — L03116 Cellulitis of left lower limb: Secondary | ICD-10-CM | POA: Diagnosis not present

## 2018-11-11 DIAGNOSIS — I872 Venous insufficiency (chronic) (peripheral): Secondary | ICD-10-CM | POA: Diagnosis not present

## 2018-11-11 DIAGNOSIS — L97322 Non-pressure chronic ulcer of left ankle with fat layer exposed: Secondary | ICD-10-CM | POA: Diagnosis not present

## 2018-11-12 ENCOUNTER — Telehealth: Payer: Self-pay

## 2018-11-12 NOTE — Telephone Encounter (Signed)
Samuel Jones, Is there a way to switch this service company for Home health?

## 2018-11-12 NOTE — Telephone Encounter (Signed)
Patient wife would like [facility] to see her husband instead. She is requesting the same nurse to care for both of them.

## 2018-11-14 ENCOUNTER — Other Ambulatory Visit: Payer: Self-pay | Admitting: Family Medicine

## 2018-11-16 NOTE — Telephone Encounter (Signed)
L.O.V. was 10/29/2018.

## 2018-11-21 DIAGNOSIS — Z9181 History of falling: Secondary | ICD-10-CM | POA: Diagnosis not present

## 2018-11-21 DIAGNOSIS — L97322 Non-pressure chronic ulcer of left ankle with fat layer exposed: Secondary | ICD-10-CM | POA: Diagnosis not present

## 2018-11-21 DIAGNOSIS — G14 Postpolio syndrome: Secondary | ICD-10-CM | POA: Diagnosis not present

## 2018-11-21 DIAGNOSIS — I10 Essential (primary) hypertension: Secondary | ICD-10-CM | POA: Diagnosis not present

## 2018-11-21 DIAGNOSIS — L03116 Cellulitis of left lower limb: Secondary | ICD-10-CM | POA: Diagnosis not present

## 2018-11-21 DIAGNOSIS — I872 Venous insufficiency (chronic) (peripheral): Secondary | ICD-10-CM | POA: Diagnosis not present

## 2018-11-22 ENCOUNTER — Telehealth: Payer: Self-pay | Admitting: Family Medicine

## 2018-11-22 NOTE — Telephone Encounter (Signed)
Wife - Samuel Jones calling for pt.  Bout with cellulitis on leg again. No open sores - pink skin.  May need to put back on antibiotic.  Wanting to know if this can be done?  Please advise.  Thanks, [employer]

## 2018-11-22 NOTE — Telephone Encounter (Signed)
Patient's wife Verdie advised. She requested a appointment for Tuesday afternoon. Appointment scheduled with Minna Merritts since Dr. Brita Romp will not be in the office.

## 2018-11-22 NOTE — Telephone Encounter (Signed)
Can we schedule a virtual visit to re-evaluate or maybe home health nurse can weigh in on if it looks infected

## 2018-11-23 ENCOUNTER — Ambulatory Visit (INDEPENDENT_AMBULATORY_CARE_PROVIDER_SITE_OTHER): Payer: Medicare Other | Admitting: Physician Assistant

## 2018-11-23 DIAGNOSIS — L03116 Cellulitis of left lower limb: Secondary | ICD-10-CM

## 2018-11-23 MED ORDER — SULFAMETHOXAZOLE-TRIMETHOPRIM 800-160 MG PO TABS: 1.0000 | ORAL_TABLET | Freq: Two times a day (BID) | ORAL | 0 refills | Status: AC

## 2018-11-23 NOTE — Patient Instructions (Signed)

## 2018-11-23 NOTE — Progress Notes (Signed)
Patient: Samuel Jones Male    DOB: 1934-01-01   85 y.o.   MRN: [id number] Visit Date: 11/23/2018  Today's Provider: Trinna Post, PA-C   Chief Complaint  Patient presents with  . Cellulitis   Subjective:    Virtual Visit via Video Note  I connected with [name] on 11/23/18 at  1:40 PM EDT by a video enabled telemedicine application and verified that I am speaking with the correct person using two identifiers.   I discussed the limitations of evaluation and management by telemedicine and the availability of in person appointments. The patient expressed understanding and agreed to proceed.   Patient location: home Provider location: [place] office Persons involved in the visit: patient, provider    HPI  Patient with history of HTN and hypothyroidism is presenting today with redness of left leg. He has had this issue before. On 10/13/2018 he was seen in this clinic with a large and red left leg. Doppler US was negative for DVT and he was given doxycycline. On 10/25/2018 he was seen again and antibiotic was changed to bactrim. On 10/29/2018 reassessment this was found to be improving. He had completed entire course of antibiotics and wounds started healing and weeping decreased. Today, his wife reports the redness of his leg has returned. Denies fevers, chills, N/V. Reports home health nurse commented that she thought cellulitis was returning.   Referrals had been made to vascular surgery and wound clinic. Wounds had healed by the time of wound appointment and patient's wife cancelled vascular surgery appointment due to patient improving.    No Known Allergies   Current Outpatient Medications:  .  acetaminophen (TYLENOL) 325 MG tablet, Take 650 mg by mouth every 6 (six) hours as needed., Disp: , Rfl:  .  allopurinol (ZYLOPRIM) 300 MG tablet, TAKE 1 TABLET BY MOUTH EVERY DAY, Disp: 90 tablet, Rfl: 1 .  aspirin EC 81 MG tablet, Take 81 mg by  mouth daily., Disp: , Rfl:  .  atenolol (TENORMIN) 25 MG tablet, TAKE 1 TABLET BY MOUTH EVERY DAY, Disp: 90 tablet, Rfl: 1 .  clobetasol (TEMOVATE) 0.05 % external solution, Apply 1 application topically 2 (two) times daily., Disp: , Rfl:  .  clotrimazole (LOTRIMIN) 1 % cream, Apply 1 application topically 2 (two) times daily. To rash on R thigh, Disp: 60 g, Rfl: 0 .  colchicine 0.6 MG tablet, TAKE 1 TABLET BY MOUTH EVERY DAY, Disp: 90 tablet, Rfl: 1 .  donepezil (ARICEPT) 10 MG tablet, TAKE 1 TABLET BY MOUTH EVERY DAY IN THE EVENING, Disp: 90 tablet, Rfl: 3 .  fluticasone (FLONASE) 50 MCG/ACT nasal spray, Place into both nostrils daily., Disp: , Rfl:  .  ketoconazole (NIZORAL) 2 % shampoo, Apply 1 application topically 2 (two) times a week., Disp: , Rfl:  .  levothyroxine (SYNTHROID) 100 MCG tablet, TAKE 1 TABLET BY MOUTH EVERY DAY BEFORE BREAKFAST, Disp: 90 tablet, Rfl: 3 .  Multiple Vitamin (MULTIVITAMIN) capsule, Take 1 capsule by mouth daily., Disp: , Rfl:  .  Multiple Vitamins-Minerals (PRESERVISION AREDS PO), Take by mouth., Disp: , Rfl:  .  NIFEdipine (ADALAT CC) 30 MG 24 hr tablet, Take 2 tablets (60 mg total) by mouth daily., Disp: 180 tablet, Rfl: 3 .  nystatin-triamcinolone (MYCOLOG II) cream, Apply 1 application topically 2 (two) times daily., Disp: , Rfl:  .  Omega-3 Fatty Acids (FISH OIL) 1000 MG CAPS, Take 3 capsules by mouth daily., Disp: , Rfl:  .  OXYGEN, Inhale into the lungs. 4 Liters O2 nightly, Disp: , Rfl:  .  sulfamethoxazole-trimethoprim (BACTRIM DS) 800-160 MG tablet, Take 1 tablet by mouth 2 (two) times daily for 7 days., Disp: 14 tablet, Rfl: 0 .  triamcinolone ointment (KENALOG) 0.5 %, Apply 1 application topically 2 (two) times daily. For rash on L ankle, Disp: 60 g, Rfl: 1  Review of Systems  Social History   Tobacco Use  . Smoking status: Former Smoker    Types: Cigarettes    Quit date: 03/09/1974    Years since quitting: 44.7  . Smokeless tobacco: Never  Used  Substance Use Topics  . Alcohol use: Never    Frequency: Never      Objective:   There were no vitals taken for this visit. There were no vitals filed for this visit.There is no height or weight on file to calculate BMI.   Physical Exam Constitutional:      General: He is not in acute distress.    Appearance: Normal appearance. He is not ill-appearing or toxic-appearing.  Skin:         Comments: Some subtle erythema from left ankle to just inferior to patella.   Neurological:     Mental Status: He is alert.      No results found for any visits on 11/23/18.     Assessment & Plan    1. Cellulitis of left lower extremity  Will restart antibiotic. Have counseled patient and his wife that I think they may find value in seeing a vascular surgeon as his recurrent cellulitis could be due to venous insufficiency, especially considering their description of his leg weeping. I will have my medical assistant reach out with an appointment for vascular surgery. F/u PRN.  - sulfamethoxazole-trimethoprim (BACTRIM DS) 800-160 MG tablet; Take 1 tablet by mouth 2 (two) times daily for 7 days.  Dispense: 14 tablet; Refill: 0  The entirety of the information documented in the History of Present Illness, Review of Systems and Physical Exam were personally obtained by me. Portions of this information were initially documented by Ashley Royalty, CMA and reviewed by me for thoroughness and accuracy.      Trinna Post, PA-C  [facility]

## 2018-12-13 ENCOUNTER — Ambulatory Visit (INDEPENDENT_AMBULATORY_CARE_PROVIDER_SITE_OTHER): Payer: Medicare Other | Admitting: Vascular Surgery

## 2018-12-13 ENCOUNTER — Other Ambulatory Visit: Payer: Self-pay

## 2018-12-13 ENCOUNTER — Encounter (INDEPENDENT_AMBULATORY_CARE_PROVIDER_SITE_OTHER): Payer: Self-pay | Admitting: Vascular Surgery

## 2018-12-13 VITALS — BP 135/72 | HR 91 | Resp 16 | Ht 72.0 in | Wt 208.0 lb

## 2018-12-13 DIAGNOSIS — I890 Lymphedema, not elsewhere classified: Secondary | ICD-10-CM | POA: Diagnosis not present

## 2018-12-13 DIAGNOSIS — I10 Essential (primary) hypertension: Secondary | ICD-10-CM

## 2018-12-13 NOTE — Progress Notes (Signed)
MRN : [id number]  Samuel Jones is a 85 y.o. (09/26/1933) male who presents with chief complaint of No chief complaint on file. Marland Kitchen  History of Present Illness:   Patient is seen for evaluation of leg swelling. The patient first noticed the swelling remotely but is now concerned because of a significant increase in the overall edema.  The left leg is worse than the right leg.  The swelling is associated with pain and discoloration. The patient notes that in the morning the legs are significantly improved but they steadily worsened throughout the course of the day. Elevation makes the legs better, dependency makes them much worse.   He already has a lymph pump but rarely uses it.  There is no history of ulcerations associated with the swelling.   The patient denies any recent changes in their medications.  The patient has not been wearing graduated compression.  The patient has no had any past angiography, interventions or vascular surgery.  The patient denies a history of DVT or PE. There is no prior history of phlebitis. There is no history of primary lymphedema.  There is no history of radiation treatment to the groin or pelvis No history of malignancies. No history of trauma or groin or pelvic surgery. No history of foreign travel or parasitic infections area    Current Meds  Medication Sig  . acetaminophen (TYLENOL) 325 MG tablet Take 650 mg by mouth every 6 (six) hours as needed.  Marland Kitchen allopurinol (ZYLOPRIM) 300 MG tablet TAKE 1 TABLET BY MOUTH EVERY DAY  . aspirin EC 81 MG tablet Take 81 mg by mouth daily.  Marland Kitchen atenolol (TENORMIN) 25 MG tablet TAKE 1 TABLET BY MOUTH EVERY DAY  . clobetasol (TEMOVATE) 0.05 % external solution Apply 1 application topically 2 (two) times daily.  . clotrimazole (LOTRIMIN) 1 % cream Apply 1 application topically 2 (two) times daily. To rash on R thigh  . colchicine 0.6 MG tablet TAKE 1 TABLET BY MOUTH EVERY DAY  . donepezil (ARICEPT) 10 MG tablet  TAKE 1 TABLET BY MOUTH EVERY DAY IN THE EVENING  . doxycycline (DORYX) 100 MG EC tablet Take 100 mg by mouth 2 (two) times daily.  . fluticasone (FLONASE) 50 MCG/ACT nasal spray Place into both nostrils daily.  Marland Kitchen ketoconazole (NIZORAL) 2 % shampoo Apply 1 application topically 2 (two) times a week.  . levothyroxine (SYNTHROID) 100 MCG tablet TAKE 1 TABLET BY MOUTH EVERY DAY BEFORE BREAKFAST  . Multiple Vitamin (MULTIVITAMIN) capsule Take 1 capsule by mouth daily.  . Multiple Vitamins-Minerals (PRESERVISION AREDS PO) Take by mouth.  Marland Kitchen NIFEdipine (ADALAT CC) 30 MG 24 hr tablet Take 2 tablets (60 mg total) by mouth daily.  Marland Kitchen nystatin-triamcinolone (MYCOLOG II) cream Apply 1 application topically 2 (two) times daily.  . Omega-3 Fatty Acids (FISH OIL) 1000 MG CAPS Take 3 capsules by mouth daily.  . OXYGEN Inhale into the lungs. 4 Liters O2 nightly  . triamcinolone ointment (KENALOG) 0.5 % Apply 1 application topically 2 (two) times daily. For rash on L ankle    Past Medical History:  Diagnosis Date  . Anxiety   . Cataract   . Colon cancer ([place]) 2012  . Gout   . Hyperlipidemia   . Hypertension   . Melanoma ([place])   . Oxygen deficiency   . Post-polio syndrome   . Stroke ([place])   . Thyroid disease     Past Surgical History:  Procedure Laterality Date  . APPENDECTOMY    .  CARPAL TUNNEL RELEASE     R wrist  . CATARACT EXTRACTION Bilateral   . CHOLECYSTECTOMY    . COLON SURGERY    . FRACTURE SURGERY     R knee and L ankle  . HERNIA REPAIR    . MELANOMA EXCISION      Social History Social History   Tobacco Use  . Smoking status: Former Smoker    Types: Cigarettes    Quit date: 03/09/1974    Years since quitting: 44.7  . Smokeless tobacco: Never Used  Substance Use Topics  . Alcohol use: Never    Frequency: Never  . Drug use: Never    Family History Family History  Problem Relation Age of Onset  . Diabetes Mother   No family history of bleeding/clotting disorders,  porphyria or autoimmune disease   No Known Allergies   REVIEW OF SYSTEMS (Negative unless checked)  Constitutional: [] Weight loss  [] Fever  [] Chills Cardiac: [] Chest pain   [] Chest pressure   [] Palpitations   [] Shortness of breath when laying flat   [] Shortness of breath with exertion. Vascular:  [] Pain in legs with walking   [] Pain in legs at rest  [] History of DVT   [] Phlebitis   [x] Swelling in legs   [] Varicose veins   [] Non-healing ulcers Pulmonary:   [] Uses home oxygen   [] Productive cough   [] Hemoptysis   [] Wheeze  [] COPD   [] Asthma Neurologic:  [] Dizziness   [] Seizures   [] History of stroke   [] History of TIA  [] Aphasia   [] Vissual changes   [] Weakness or numbness in arm   [x] Weakness or numbness in leg Musculoskeletal:   [] Joint swelling   [] Joint pain   [] Low back pain Hematologic:  [] Easy bruising  [] Easy bleeding   [] Hypercoagulable state   [] Anemic Gastrointestinal:  [] Diarrhea   [] Vomiting  [] Gastroesophageal reflux/heartburn   [] Difficulty swallowing. Genitourinary:  [] Chronic kidney disease   [] Difficult urination  [] Frequent urination   [] Blood in urine Skin:  [] Rashes   [] Ulcers  Psychological:  [] History of anxiety   []  History of major depression.  Physical Examination  Vitals:   12/13/18 1306  BP: 135/72  Pulse: 91  Resp: 16  Weight: 208 lb (94.3 kg)  Height: 6' (1.829 m)   Body mass index is 28.21 kg/m. Gen: WD/WN, NAD Head: [place]/AT, No temporalis wasting.  Ear/Nose/Throat: Hearing grossly intact, nares w/o erythema or drainage, poor dentition Eyes: PER, EOMI, sclera nonicteric.  Neck: Supple, no masses.  No bruit or JVD.  Pulmonary:  Good air movement, clear to auscultation bilaterally, no use of accessory muscles.  Cardiac: RRR, normal S1, S2, no Murmurs. Vascular: scattered varicosities present bilaterally.  Moderate to severe venous stasis changes to the legs bilaterally.  3+ soft pitting edema on the left 2+ on the right Vessel Right Left  PT Palpable  1+ Palpable  DP Palpable Not Palpable   Gastrointestinal: soft, non-distended. No guarding/no peritoneal signs.  Musculoskeletal: M/S 5/5 throughout.  No deformity or atrophy.  Neurologic: CN 2-12 intact. Pain and light touch intact in extremities.  Symmetrical.  Speech is fluent. Motor exam as listed above. Psychiatric: Judgment intact, Mood & affect appropriate for pt's clinical situation. Dermatologic: venous rashes no ulcers noted.  No changes consistent with cellulitis. Lymph : No Cervical lymphadenopathy, no lichenification or skin changes of chronic lymphedema.  CBC No results found for: WBC, HGB, HCT, MCV, PLT  BMET    Component Value Date/Time   NA 141 11/25/2017 1626   K 4.7 11/25/2017  1626   CL 102 11/25/2017 1626   CO2 25 11/25/2017 1626   GLUCOSE 102 (H) 11/25/2017 1626   BUN 15 11/25/2017 1626   CREATININE 0.69 (L) 11/25/2017 1626   CALCIUM 9.7 11/25/2017 1626   GFRNONAA 88 11/25/2017 1626   GFRAA 102 11/25/2017 1626   CrCl cannot be calculated (Patient's most recent lab result is older than the maximum 21 days allowed.).  COAG No results found for: INR, PROTIME  Radiology No results found.   Assessment/Plan 1. Lymphedema  No surgery or intervention at this point in time.    I have reviewed my discussion with the patient regarding lymphedema and why it  causes symptoms.  Patient will continue wearing graduated compression stockings class 1 (20-30 mmHg) on a daily basis a prescription was given. The patient is reminded to put the stockings on first thing in the morning and removing them in the evening. The patient is instructed specifically not to sleep in the stockings.   In addition, behavioral modification throughout the day will be continued.  This will include frequent elevation (such as in a recliner), use of over the counter pain medications as needed and exercise such as walking.  I have reviewed systemic causes for chronic edema such as liver, kidney  and cardiac etiologies and there does not appear to be any significant changes in these organ systems over the past year.  The patient is under the impression that these organ systems are all stable and unchanged.    The patient will continue aggressive use of the  lymph pump.  This will continue to improve the edema control and prevent sequela such as ulcers and infections.   The patient will follow-up with me on an annual basis.    2. Essential hypertension Continue antihypertensive medications as already ordered, these medications have been reviewed and there are no changes at this time.     Hortencia Pilar, MD  12/13/2018 1:13 PM

## 2018-12-28 ENCOUNTER — Other Ambulatory Visit: Payer: Self-pay | Admitting: Family Medicine

## 2018-12-28 ENCOUNTER — Ambulatory Visit: Payer: Medicare Other

## 2018-12-29 ENCOUNTER — Ambulatory Visit (INDEPENDENT_AMBULATORY_CARE_PROVIDER_SITE_OTHER): Payer: Medicare Other

## 2018-12-29 ENCOUNTER — Other Ambulatory Visit: Payer: Self-pay

## 2018-12-29 DIAGNOSIS — Z23 Encounter for immunization: Secondary | ICD-10-CM | POA: Diagnosis not present

## 2019-01-27 ENCOUNTER — Telehealth: Payer: Self-pay | Admitting: Family Medicine

## 2019-01-27 DIAGNOSIS — G14 Postpolio syndrome: Secondary | ICD-10-CM

## 2019-01-27 DIAGNOSIS — Z8673 Personal history of transient ischemic attack (TIA), and cerebral infarction without residual deficits: Secondary | ICD-10-CM

## 2019-01-27 NOTE — Telephone Encounter (Signed)
Samuel Jones, [place] with [facility], currently visits patients wife (also a patient of Dr. Brita Romp) . However, Samuel Jones, has noticed that patient would likely benefit from PT himself. Samuel Jones calling on behalf of both patient and wife to inquire if Dr. Brita Romp would approve PT for patient as well.

## 2019-01-27 NOTE — Telephone Encounter (Signed)
Please advise 

## 2019-01-27 NOTE — Telephone Encounter (Signed)
I think that the patient I have talked about this before, but if he is agreeable now, I am happy for him to get home health PT.  Okay to place the order for home health physical therapy for his history of stroke and post polio syndrome

## 2019-01-27 NOTE — Telephone Encounter (Signed)
From [facility] 

## 2019-01-28 NOTE — Telephone Encounter (Signed)
Referral done

## 2019-01-29 DIAGNOSIS — E039 Hypothyroidism, unspecified: Secondary | ICD-10-CM | POA: Diagnosis not present

## 2019-01-29 DIAGNOSIS — I10 Essential (primary) hypertension: Secondary | ICD-10-CM | POA: Diagnosis not present

## 2019-01-29 DIAGNOSIS — L03116 Cellulitis of left lower limb: Secondary | ICD-10-CM | POA: Diagnosis not present

## 2019-01-29 DIAGNOSIS — Z9981 Dependence on supplemental oxygen: Secondary | ICD-10-CM | POA: Diagnosis not present

## 2019-01-29 DIAGNOSIS — Z87891 Personal history of nicotine dependence: Secondary | ICD-10-CM | POA: Diagnosis not present

## 2019-01-29 DIAGNOSIS — G14 Postpolio syndrome: Secondary | ICD-10-CM | POA: Diagnosis not present

## 2019-02-02 DIAGNOSIS — E039 Hypothyroidism, unspecified: Secondary | ICD-10-CM | POA: Diagnosis not present

## 2019-02-02 DIAGNOSIS — Z87891 Personal history of nicotine dependence: Secondary | ICD-10-CM | POA: Diagnosis not present

## 2019-02-02 DIAGNOSIS — I10 Essential (primary) hypertension: Secondary | ICD-10-CM | POA: Diagnosis not present

## 2019-02-02 DIAGNOSIS — L03116 Cellulitis of left lower limb: Secondary | ICD-10-CM | POA: Diagnosis not present

## 2019-02-02 DIAGNOSIS — Z9981 Dependence on supplemental oxygen: Secondary | ICD-10-CM | POA: Diagnosis not present

## 2019-02-02 DIAGNOSIS — G14 Postpolio syndrome: Secondary | ICD-10-CM | POA: Diagnosis not present

## 2019-02-07 DIAGNOSIS — G14 Postpolio syndrome: Secondary | ICD-10-CM | POA: Diagnosis not present

## 2019-02-07 DIAGNOSIS — E039 Hypothyroidism, unspecified: Secondary | ICD-10-CM | POA: Diagnosis not present

## 2019-02-07 DIAGNOSIS — Z9981 Dependence on supplemental oxygen: Secondary | ICD-10-CM | POA: Diagnosis not present

## 2019-02-07 DIAGNOSIS — L03116 Cellulitis of left lower limb: Secondary | ICD-10-CM | POA: Diagnosis not present

## 2019-02-07 DIAGNOSIS — Z87891 Personal history of nicotine dependence: Secondary | ICD-10-CM | POA: Diagnosis not present

## 2019-02-07 DIAGNOSIS — I10 Essential (primary) hypertension: Secondary | ICD-10-CM | POA: Diagnosis not present

## 2019-02-10 DIAGNOSIS — Z87891 Personal history of nicotine dependence: Secondary | ICD-10-CM | POA: Diagnosis not present

## 2019-02-10 DIAGNOSIS — G14 Postpolio syndrome: Secondary | ICD-10-CM | POA: Diagnosis not present

## 2019-02-10 DIAGNOSIS — L03116 Cellulitis of left lower limb: Secondary | ICD-10-CM | POA: Diagnosis not present

## 2019-02-10 DIAGNOSIS — I10 Essential (primary) hypertension: Secondary | ICD-10-CM | POA: Diagnosis not present

## 2019-02-10 DIAGNOSIS — Z9981 Dependence on supplemental oxygen: Secondary | ICD-10-CM | POA: Diagnosis not present

## 2019-02-10 DIAGNOSIS — E039 Hypothyroidism, unspecified: Secondary | ICD-10-CM | POA: Diagnosis not present

## 2019-02-14 DIAGNOSIS — Z85820 Personal history of malignant melanoma of skin: Secondary | ICD-10-CM | POA: Diagnosis not present

## 2019-02-14 DIAGNOSIS — D1801 Hemangioma of skin and subcutaneous tissue: Secondary | ICD-10-CM | POA: Diagnosis not present

## 2019-02-14 DIAGNOSIS — L578 Other skin changes due to chronic exposure to nonionizing radiation: Secondary | ICD-10-CM | POA: Diagnosis not present

## 2019-02-14 DIAGNOSIS — L700 Acne vulgaris: Secondary | ICD-10-CM | POA: Diagnosis not present

## 2019-02-14 DIAGNOSIS — L821 Other seborrheic keratosis: Secondary | ICD-10-CM | POA: Diagnosis not present

## 2019-02-14 DIAGNOSIS — Z1283 Encounter for screening for malignant neoplasm of skin: Secondary | ICD-10-CM | POA: Diagnosis not present

## 2019-02-14 DIAGNOSIS — L219 Seborrheic dermatitis, unspecified: Secondary | ICD-10-CM | POA: Diagnosis not present

## 2019-02-14 DIAGNOSIS — L719 Rosacea, unspecified: Secondary | ICD-10-CM | POA: Diagnosis not present

## 2019-02-15 DIAGNOSIS — E039 Hypothyroidism, unspecified: Secondary | ICD-10-CM | POA: Diagnosis not present

## 2019-02-15 DIAGNOSIS — Z9981 Dependence on supplemental oxygen: Secondary | ICD-10-CM | POA: Diagnosis not present

## 2019-02-15 DIAGNOSIS — I10 Essential (primary) hypertension: Secondary | ICD-10-CM | POA: Diagnosis not present

## 2019-02-15 DIAGNOSIS — G14 Postpolio syndrome: Secondary | ICD-10-CM | POA: Diagnosis not present

## 2019-02-15 DIAGNOSIS — L03116 Cellulitis of left lower limb: Secondary | ICD-10-CM | POA: Diagnosis not present

## 2019-02-15 DIAGNOSIS — Z87891 Personal history of nicotine dependence: Secondary | ICD-10-CM | POA: Diagnosis not present

## 2019-02-18 DIAGNOSIS — L03116 Cellulitis of left lower limb: Secondary | ICD-10-CM | POA: Diagnosis not present

## 2019-02-18 DIAGNOSIS — Z9981 Dependence on supplemental oxygen: Secondary | ICD-10-CM | POA: Diagnosis not present

## 2019-02-18 DIAGNOSIS — Z87891 Personal history of nicotine dependence: Secondary | ICD-10-CM | POA: Diagnosis not present

## 2019-02-18 DIAGNOSIS — E039 Hypothyroidism, unspecified: Secondary | ICD-10-CM | POA: Diagnosis not present

## 2019-02-18 DIAGNOSIS — I10 Essential (primary) hypertension: Secondary | ICD-10-CM | POA: Diagnosis not present

## 2019-02-18 DIAGNOSIS — G14 Postpolio syndrome: Secondary | ICD-10-CM | POA: Diagnosis not present

## 2019-02-22 DIAGNOSIS — Z9981 Dependence on supplemental oxygen: Secondary | ICD-10-CM | POA: Diagnosis not present

## 2019-02-22 DIAGNOSIS — E039 Hypothyroidism, unspecified: Secondary | ICD-10-CM | POA: Diagnosis not present

## 2019-02-22 DIAGNOSIS — I10 Essential (primary) hypertension: Secondary | ICD-10-CM | POA: Diagnosis not present

## 2019-02-22 DIAGNOSIS — Z87891 Personal history of nicotine dependence: Secondary | ICD-10-CM | POA: Diagnosis not present

## 2019-02-22 DIAGNOSIS — G14 Postpolio syndrome: Secondary | ICD-10-CM | POA: Diagnosis not present

## 2019-02-22 DIAGNOSIS — L03116 Cellulitis of left lower limb: Secondary | ICD-10-CM | POA: Diagnosis not present

## 2019-02-25 DIAGNOSIS — G14 Postpolio syndrome: Secondary | ICD-10-CM | POA: Diagnosis not present

## 2019-02-25 DIAGNOSIS — L03116 Cellulitis of left lower limb: Secondary | ICD-10-CM | POA: Diagnosis not present

## 2019-02-25 DIAGNOSIS — I10 Essential (primary) hypertension: Secondary | ICD-10-CM | POA: Diagnosis not present

## 2019-02-25 DIAGNOSIS — E039 Hypothyroidism, unspecified: Secondary | ICD-10-CM | POA: Diagnosis not present

## 2019-02-25 DIAGNOSIS — Z87891 Personal history of nicotine dependence: Secondary | ICD-10-CM | POA: Diagnosis not present

## 2019-02-25 DIAGNOSIS — Z9981 Dependence on supplemental oxygen: Secondary | ICD-10-CM | POA: Diagnosis not present

## 2019-03-15 ENCOUNTER — Other Ambulatory Visit: Payer: Self-pay | Admitting: Family Medicine

## 2019-04-30 ENCOUNTER — Other Ambulatory Visit: Payer: Self-pay | Admitting: Family Medicine

## 2019-04-30 NOTE — Telephone Encounter (Signed)
Requested Prescriptions  Pending Prescriptions Disp Refills  . NIFEdipine (ADALAT CC) 30 MG 24 hr tablet [Pharmacy Med Name: NIFEDIPINE ER 30 MG TABLET] 180 tablet 3    Sig: TAKE 2 TABLETS BY MOUTH DAILY     Cardiovascular:  Calcium Channel Blockers Passed - 04/30/2019  8:29 AM      Passed - Last BP in normal range    BP Readings from Last 1 Encounters:  12/13/18 135/72         Passed - Valid encounter within last 6 months    Recent Outpatient Visits          5 months ago Cellulitis of left lower extremity   [place], [place], PA-C   6 months ago Cellulitis of left lower extremity   [facility] [place], Dionne Bucy, MD   6 months ago Cellulitis of left lower extremity   [facility] [place], Dionne Bucy, MD   6 months ago Cellulitis of left lower extremity   [place], [place]   8 months ago Acute cystitis with hematuria   [facility] Jerrol Banana., MD

## 2019-06-03 DIAGNOSIS — T161XXA Foreign body in right ear, initial encounter: Secondary | ICD-10-CM | POA: Diagnosis not present

## 2019-06-23 NOTE — Progress Notes (Addendum)
Subjective:   Samuel Jones is a 86 y.o. male who presents for an Initial Medicare Annual Wellness Visit.  Review of Systems  N/A  Cardiac Risk Factors include: hypertension;male gender;obesity (BMI >30kg/m2);advanced age (>57men, >67 women)    Objective:    Today's Vitals   06/27/19 1359  BP: 126/64  Pulse: 83  Temp: 98.1 F (36.7 C)  TempSrc: Oral  Weight: 230 lb (104.3 kg)  Height: 6' (1.829 m)  PainSc: 0-No pain   Body mass index is 31.19 kg/m.  Advanced Directives 06/27/2019  Does Patient Have a Medical Advance Directive? Yes  Type of Paramedic of [place];Living will  Copy of [place] in Chart? Yes - validated most recent copy scanned in chart (See row information)    Current Medications (verified) Outpatient Encounter Medications as of 06/27/2019  Medication Sig  . acetaminophen (TYLENOL) 325 MG tablet Take 650 mg by mouth every 6 (six) hours as needed.  Marland Kitchen allopurinol (ZYLOPRIM) 300 MG tablet TAKE 1 TABLET BY MOUTH EVERY DAY  . aspirin EC 81 MG tablet Take 81 mg by mouth daily.  Marland Kitchen atenolol (TENORMIN) 25 MG tablet TAKE 1 TABLET BY MOUTH EVERY DAY  . colchicine 0.6 MG tablet TAKE 1 TABLET BY MOUTH EVERY DAY  . donepezil (ARICEPT) 10 MG tablet TAKE 1 TABLET BY MOUTH EVERY DAY IN THE EVENING  . FLUoxetine (PROZAC) 20 MG tablet Take 20 mg by mouth daily.  . fluticasone (FLONASE) 50 MCG/ACT nasal spray Place into both nostrils daily.  Marland Kitchen ketoconazole (NIZORAL) 2 % shampoo Apply 1 application topically 2 (two) times a week.  . levothyroxine (SYNTHROID) 100 MCG tablet TAKE 1 TABLET BY MOUTH EVERY DAY BEFORE BREAKFAST  . metroNIDAZOLE (METROGEL) 0.75 % gel APPLY A SMALL AMOUNT TO AFFECTED AREA EVERY EVENING  . Multiple Vitamin (MULTIVITAMIN) capsule Take 1 capsule by mouth daily.  . Multiple Vitamins-Minerals (OCUVITE PO) Take 120 mg by mouth daily.  Marland Kitchen NIFEdipine (ADALAT CC) 30 MG 24 hr tablet TAKE 2 TABLETS BY MOUTH DAILY    . Omega-3 Fatty Acids (FISH OIL) 1000 MG CAPS Take 3 capsules by mouth daily.  . OXYGEN Inhale into the lungs. 4 Liters O2 nightly  . clobetasol (TEMOVATE) 0.05 % external solution Apply 1 application topically 2 (two) times daily.  . clotrimazole (LOTRIMIN) 1 % cream Apply 1 application topically 2 (two) times daily. To rash on R thigh (Patient not taking: Reported on 06/27/2019)  . doxycycline (DORYX) 100 MG EC tablet Take 100 mg by mouth 2 (two) times daily.  . Multiple Vitamins-Minerals (PRESERVISION AREDS PO) Take by mouth.  . nystatin-triamcinolone (MYCOLOG II) cream Apply 1 application topically 2 (two) times daily.  Marland Kitchen triamcinolone ointment (KENALOG) 0.5 % Apply 1 application topically 2 (two) times daily. For rash on L ankle (Patient not taking: Reported on 06/27/2019)   No facility-administered encounter medications on file as of 06/27/2019.    Allergies (verified) Patient has no known allergies.   History: Past Medical History:  Diagnosis Date  . Anxiety   . Cataract   . Colon cancer ([place]) 2012  . Gout   . Hyperlipidemia   . Hypertension   . Melanoma ([place])   . Oxygen deficiency   . Post-polio syndrome   . Stroke ([place])   . Thyroid disease    Past Surgical History:  Procedure Laterality Date  . APPENDECTOMY    . CARPAL TUNNEL RELEASE     R wrist  . CATARACT EXTRACTION  Bilateral   . CHOLECYSTECTOMY    . COLON SURGERY    . FRACTURE SURGERY     R knee and L ankle  . HERNIA REPAIR    . MELANOMA EXCISION     Family History  Problem Relation Age of Onset  . Diabetes Mother    Social History   Socioeconomic History  . Marital status: Married    Spouse name: Not on file  . Number of children: 4  . Years of education: Not on file  . Highest education level: High school graduate  Occupational History  . Occupation: retired     Comment: Catering manager  Tobacco Use  . Smoking status: Former Smoker    Types: Cigarettes    Quit date: 03/09/1974    Years  since quitting: 45.3  . Smokeless tobacco: Never Used  Substance and Sexual Activity  . Alcohol use: Never  . Drug use: Never  . Sexual activity: Not on file  Other Topics Concern  . Not on file  Social History Narrative  . Not on file   Social Determinants of Health   Financial Resource Strain: Low Risk   . Difficulty of Paying Living Expenses: Not hard at all  Food Insecurity: No Food Insecurity  . Worried About Charity fundraiser in the Last Year: Never true  . Ran Out of Food in the Last Year: Never true  Transportation Needs: No Transportation Needs  . Lack of Transportation (Medical): No  . Lack of Transportation (Non-Medical): No  Physical Activity: Inactive  . Days of Exercise per Week: 0 days  . Minutes of Exercise per Session: 0 min  Stress: No Stress Concern Present  . Feeling of Stress : Not at all  Social Connections: Slightly Isolated  . Frequency of Communication with Friends and Family: Twice a week  . Frequency of Social Gatherings with Friends and Family: Twice a week  . Attends Religious Services: Never  . Active Member of Clubs or Organizations: Yes  . Attends Archivist Meetings: Never  . Marital Status: Married   Tobacco Counseling Counseling given: Not Answered   Clinical Intake:  Pre-visit preparation completed: Yes  Pain : No/denies pain Pain Score: 0-No pain     Nutritional Status: BMI > 30  Obese Nutritional Risks: None Diabetes: No  How often do you need to have someone help you when you read instructions, pamphlets, or other written materials from your doctor or pharmacy?: 1 - Never  Interpreter Needed?: No  Information entered by :: [facility], LPN  Activities of Daily Living In your present state of health, do you have any difficulty performing the following activities: 06/27/2019  Hearing? Y  Comment Wears bilateral hearing aids.  Vision? Y  Comment Needs a new eye glass prescription. Scheduled 07/11/19.  Difficulty  concentrating or making decisions? Y  Comment Currently on Aricept.  Walking or climbing stairs? Y  Comment Due to mobility issues. Uses a walker at all times.  Dressing or bathing? N  Doing errands, shopping? Y  Comment Does not drive.  Preparing Food and eating ? N  Using the Toilet? N  In the past six months, have you accidently leaked urine? N  Do you have problems with loss of bowel control? N  Managing your Medications? N  Managing your Finances? N  Housekeeping or managing your Housekeeping? Y  Comment Has assistance cleaning.  Some recent data might be hidden     Immunizations and Health Maintenance Immunization History  Administered Date(s) Administered  . Fluad Quad(high Dose 65+) 12/29/2018  . Influenza, High Dose Seasonal PF 11/25/2017  . Pneumococcal Conjugate-13 01/23/2015  . Pneumococcal Polysaccharide-23 08/17/2011  . Zoster 08/09/2010   Health Maintenance Due  Topic Date Due  . COVID-19 Vaccine (1) Never done  . COLONOSCOPY  09/13/2016    Patient Care Team: Virginia Crews, MD as PCP - General (Family Medicine) Ralene Bathe, MD (Dermatology)  Indicate any recent Medical Services you may have received from other than [facility] providers in the past year (date may be approximate).    Assessment:   This is a routine wellness examination for Edu.  Hearing/Vision screen No exam data present  Dietary issues and exercise activities discussed: Current Exercise Habits: The patient does not participate in regular exercise at present, Exercise limited by: orthopedic condition(s)  Goals    . LIFESTYLE - DECREASE FALLS RISK     Recommend to remove any items from the home that may cause slips or trips.      Depression Screen PHQ 2/9 Scores 06/27/2019 09/18/2017  PHQ - 2 Score 0 0    Fall Risk Fall Risk  06/27/2019 09/18/2017  Falls in the past year? 1 Yes  Number falls in past yr: 0 1  Injury with Fall? 0 No  Risk for fall due to : Impaired  mobility;Impaired balance/gait -  Follow up Falls prevention discussed Falls evaluation completed    FALL RISK PREVENTION PERTAINING TO THE HOME:  Any stairs in or around the home? Yes  If so, are there any without handrails? No   Home free of loose throw rugs in walkways, pet beds, electrical cords, etc? Yes  Adequate lighting in your home to reduce risk of falls? Yes   ASSISTIVE DEVICES UTILIZED TO PREVENT FALLS:  Life alert? No  Use of a cane, walker or w/c? Yes  Grab bars in the bathroom? Yes  Shower chair or bench in shower? Yes  Elevated toilet seat or a handicapped toilet? Yes    TIMED UP AND GO:  Was the test performed? No .    Cognitive Function: Declined today. Currently on Aricept.          Screening Tests Health Maintenance  Topic Date Due  . COVID-19 Vaccine (1) Never done  . COLONOSCOPY  09/13/2016  . TETANUS/TDAP  11/26/2023 (Originally 10/22/1952)  . INFLUENZA VACCINE  10/09/2019  . PNA vac Low Risk Adult  Completed    Qualifies for Shingles Vaccine? Yes  Zostavax completed 08/09/10. Due for Shingrix. Pt has been advised to call insurance company to determine out of pocket expense. Advised may also receive vaccine at local pharmacy or Health Dept. Verbalized acceptance and understanding.  Tdap: Although this vaccine is not a covered service during a Wellness Exam, does the patient still wish to receive this vaccine today?  No . Advised may receive this vaccine at local pharmacy or Health Dept. Aware to provide a copy of the vaccination record if obtained from local pharmacy or Health Dept. Verbalized acceptance and understanding.  Flu Vaccine: Up to date  Pneumococcal Vaccine: Completed series  Cancer Screenings:  Colorectal Screening: Completed 09/13/13. Repeat every 3 years. Referral to GI placed today. Pt aware the office will call re: appt.  Lung Cancer Screening: (Low Dose CT Chest recommended if Age 69-80 years, 30 pack-year currently smoking OR  have quit w/in 15years.) does not qualify.   Additional Screening:  Vision Screening: Recommended annual ophthalmology exams for early detection of  glaucoma and other disorders of the eye.  Dental Screening: Recommended annual dental exams for proper oral hygiene  Community Resource Referral:  CRR required this visit?  No        Plan:  I have personally reviewed and addressed the Medicare Annual Wellness questionnaire and have noted the following in the patient's chart:  A. Medical and social history B. Use of alcohol, tobacco or illicit drugs  C. Current medications and supplements D. Functional ability and status E.  Nutritional status F.  Physical activity G. Advance directives H. List of other physicians I.  Hospitalizations, surgeries, and ER visits in previous 12 months J.  [place] such as hearing and vision if needed, cognitive and depression L. Referrals and appointments   In addition, I have reviewed and discussed with patient certain preventive protocols, quality metrics, and best practice recommendations. A written personalized care plan for preventive services as well as general preventive health recommendations were provided to patient.   [place] Score, [place]   [id number]  Nurse Health Advisor   Nurse Notes: Colonoscopy referral placed today.

## 2019-06-24 NOTE — Progress Notes (Signed)
I,Laura E Walsh,acting as a scribe for Lavon Paganini, MD.,have documented all relevant documentation on the behalf of Lavon Paganini, MD,as directed by  Lavon Paganini, MD while in the presence of Lavon Paganini, MD.  Established patient visit    Patient: Samuel Jones   DOB: August 31, 1933   86 y.o. Male  MRN: [id number] Visit Date: 06/27/2019  Today's healthcare provider: Lavon Paganini, MD  Subjective:    Chief Complaint  Patient presents with  . Medicare Wellness  . Hypertension  . Hypothyroidism   Thyroid Problem Presents for follow-up visit. Patient reports no cold intolerance, depressed mood, diarrhea, fatigue, heat intolerance, hoarse voice, nail problem, palpitations, visual change, weight gain or weight loss. The symptoms have been stable.     Hypertension, follow-up  BP Readings from Last 3 Encounters:  06/27/19 126/64  06/27/19 126/64  12/13/18 135/72   Wt Readings from Last 3 Encounters:  06/27/19 230 lb (104.3 kg)  06/27/19 230 lb (104.3 kg)  12/13/18 208 lb (94.3 kg)     He was last seen for hypertension 1 years ago.  BP at that visit was 144/82. Management since that visit includes No changes. He reports excellent compliance with treatment. He is not having side effects.  He is following a Regular diet. He is not exercising. He does not smoke.  Use of agents associated with hypertension: none.   Outside blood pressures are not being checked. Symptoms: No chest pain No chest pressure/discomfort No dyspnea (difficulty breathing) No lower extremity edema No orthopnea  No palpitations No paroxysmal nocturnal dyspnea  No syncope  Pertinent labs: Lab Results  Component Value Date   CHOL 152 11/25/2017   HDL 43 11/25/2017   LDLCALC 62 11/25/2017   TRIG 236 (H) 11/25/2017   CHOLHDL 3.5 11/25/2017   Lab Results  Component Value Date   NA 141 11/25/2017   K 4.7 11/25/2017   CL 102 11/25/2017   CO2 25 11/25/2017   GLUCOSE 102  (H) 11/25/2017   BUN 15 11/25/2017   CREATININE 0.69 (L) 11/25/2017   CALCIUM 9.7 11/25/2017   GFRNONAA 88 11/25/2017   GFRAA 102 11/25/2017     The ASCVD Risk score (Goff DC Jr., et al., 2013) failed to calculate for the following reasons:   The 2013 ASCVD risk score is only valid for ages 58 to 3   The patient has a prior MI or stroke diagnosis   ---------------------------------------------------------------------------------------------------  -due for colonoscopy per report for h/o colon cancer.  Denies any bleeding or difficulty with BMs  Patient Active Problem List   Diagnosis Date Noted  . Hyperglycemia 06/27/2019  . Lymphedema 12/13/2018  . Advanced care planning/counseling discussion 11/26/2017  . Gout 09/22/2017  . Post-polio syndrome 09/18/2017  . Loss of hearing 09/18/2017  . Cerumen impaction 09/18/2017  . History of CVA (cerebrovascular accident) 09/18/2017  . History of colon cancer 09/18/2017  . History of melanoma 09/18/2017  . Hypothyroidism 09/18/2017  . Essential hypertension 09/18/2017  . Cognitive impairment 09/18/2017   Past Surgical History:  Procedure Laterality Date  . APPENDECTOMY    . CARPAL TUNNEL RELEASE     R wrist  . CATARACT EXTRACTION Bilateral   . CHOLECYSTECTOMY    . COLON SURGERY    . FRACTURE SURGERY     R knee and L ankle  . HERNIA REPAIR    . MELANOMA EXCISION     Social History   Tobacco Use  . Smoking status: Former Smoker  Types: Cigarettes    Quit date: 03/09/1974    Years since quitting: 45.3  . Smokeless tobacco: Never Used  Substance Use Topics  . Alcohol use: Never  . Drug use: Never   Social History   Socioeconomic History  . Marital status: Married    Spouse name: Not on file  . Number of children: 4  . Years of education: Not on file  . Highest education level: High school graduate  Occupational History  . Occupation: retired     Comment: Catering manager  Tobacco Use  . Smoking status:  Former Smoker    Types: Cigarettes    Quit date: 03/09/1974    Years since quitting: 45.3  . Smokeless tobacco: Never Used  Substance and Sexual Activity  . Alcohol use: Never  . Drug use: Never  . Sexual activity: Not on file  Other Topics Concern  . Not on file  Social History Narrative  . Not on file   Social Determinants of Health   Financial Resource Strain: Low Risk   . Difficulty of Paying Living Expenses: Not hard at all  Food Insecurity: No Food Insecurity  . Worried About Charity fundraiser in the Last Year: Never true  . Ran Out of Food in the Last Year: Never true  Transportation Needs: No Transportation Needs  . Lack of Transportation (Medical): No  . Lack of Transportation (Non-Medical): No  Physical Activity: Inactive  . Days of Exercise per Week: 0 days  . Minutes of Exercise per Session: 0 min  Stress: No Stress Concern Present  . Feeling of Stress : Not at all  Social Connections: Slightly Isolated  . Frequency of Communication with Friends and Family: Twice a week  . Frequency of Social Gatherings with Friends and Family: Twice a week  . Attends Religious Services: Never  . Active Member of Clubs or Organizations: Yes  . Attends Archivist Meetings: Never  . Marital Status: Married  Human resources officer Violence: Not At Risk  . Fear of Current or Ex-Partner: No  . Emotionally Abused: No  . Physically Abused: No  . Sexually Abused: No   Family Status  Relation Name Status  . Mother  Deceased  . Father  Deceased  . Daughter  Alive  . Son  Deceased       over dose on pain med  . Daughter  Alive  . Son  Alive   No Known Allergies     Medications: Outpatient Medications Prior to Visit  Medication Sig  . acetaminophen (TYLENOL) 325 MG tablet Take 650 mg by mouth every 6 (six) hours as needed.  Marland Kitchen allopurinol (ZYLOPRIM) 300 MG tablet TAKE 1 TABLET BY MOUTH EVERY DAY  . aspirin EC 81 MG tablet Take 81 mg by mouth daily.  Marland Kitchen atenolol  (TENORMIN) 25 MG tablet TAKE 1 TABLET BY MOUTH EVERY DAY  . clobetasol (TEMOVATE) 0.05 % external solution Apply 1 application topically 2 (two) times daily.  . colchicine 0.6 MG tablet TAKE 1 TABLET BY MOUTH EVERY DAY  . donepezil (ARICEPT) 10 MG tablet TAKE 1 TABLET BY MOUTH EVERY DAY IN THE EVENING  . doxycycline (DORYX) 100 MG EC tablet Take 100 mg by mouth 2 (two) times daily.  Marland Kitchen FLUoxetine (PROZAC) 20 MG tablet Take 20 mg by mouth daily.  . fluticasone (FLONASE) 50 MCG/ACT nasal spray Place into both nostrils daily.  Marland Kitchen ketoconazole (NIZORAL) 2 % shampoo Apply 1 application topically 2 (two) times a week.  Marland Kitchen  levothyroxine (SYNTHROID) 100 MCG tablet TAKE 1 TABLET BY MOUTH EVERY DAY BEFORE BREAKFAST  . metroNIDAZOLE (METROGEL) 0.75 % gel APPLY A SMALL AMOUNT TO AFFECTED AREA EVERY EVENING  . Multiple Vitamin (MULTIVITAMIN) capsule Take 1 capsule by mouth daily.  . Multiple Vitamins-Minerals (OCUVITE PO) Take 120 mg by mouth daily.  . Multiple Vitamins-Minerals (PRESERVISION AREDS PO) Take by mouth.  Marland Kitchen NIFEdipine (ADALAT CC) 30 MG 24 hr tablet TAKE 2 TABLETS BY MOUTH DAILY  . nystatin-triamcinolone (MYCOLOG II) cream Apply 1 application topically 2 (two) times daily.  . Omega-3 Fatty Acids (FISH OIL) 1000 MG CAPS Take 3 capsules by mouth daily.  . OXYGEN Inhale into the lungs. 4 Liters O2 nightly  . clotrimazole (LOTRIMIN) 1 % cream Apply 1 application topically 2 (two) times daily. To rash on R thigh (Patient not taking: Reported on 06/27/2019)  . triamcinolone ointment (KENALOG) 0.5 % Apply 1 application topically 2 (two) times daily. For rash on L ankle (Patient not taking: Reported on 06/27/2019)   No facility-administered medications prior to visit.    Review of Systems  Constitutional: Negative.  Negative for fatigue, weight gain and weight loss.  HENT: Positive for dental problem. Negative for congestion, drooling, ear discharge, ear pain, facial swelling, hearing loss, hoarse voice,  mouth sores, nosebleeds, postnasal drip, rhinorrhea, sinus pressure, sinus pain, sneezing, sore throat, tinnitus, trouble swallowing and voice change.   Eyes: Negative.   Respiratory: Negative.   Cardiovascular: Negative.  Negative for palpitations.  Gastrointestinal: Negative.  Negative for diarrhea.  Endocrine: Negative.  Negative for cold intolerance and heat intolerance.  Genitourinary: Negative.   Musculoskeletal: Positive for arthralgias and gait problem. Negative for back pain, joint swelling, myalgias, neck pain and neck stiffness.  Skin: Negative.   Allergic/Immunologic: Negative.   Hematological: Negative.   Psychiatric/Behavioral: Negative.     Last CBC No results found for: WBC, HGB, HCT, MCV, MCH, RDW, PLT Last metabolic panel Lab Results  Component Value Date   GLUCOSE 102 (H) 11/25/2017   NA 141 11/25/2017   K 4.7 11/25/2017   CL 102 11/25/2017   CO2 25 11/25/2017   BUN 15 11/25/2017   CREATININE 0.69 (L) 11/25/2017   GFRNONAA 88 11/25/2017   GFRAA 102 11/25/2017   CALCIUM 9.7 11/25/2017   PROT 6.6 11/25/2017   ALBUMIN 4.3 11/25/2017   LABGLOB 2.3 11/25/2017   AGRATIO 1.9 11/25/2017   BILITOT 0.4 11/25/2017   ALKPHOS 93 11/25/2017   AST 33 11/25/2017   ALT 38 11/25/2017   Last lipids Lab Results  Component Value Date   CHOL 152 11/25/2017   HDL 43 11/25/2017   LDLCALC 62 11/25/2017   TRIG 236 (H) 11/25/2017   CHOLHDL 3.5 11/25/2017   Last hemoglobin A1c No results found for: HGBA1C Last thyroid functions Lab Results  Component Value Date   TSH 0.026 (L) 11/25/2017   Last vitamin D No results found for: 25OHVITD2, 25OHVITD3, VD25OH Last vitamin B12 and Folate No results found for: VITAMINB12, FOLATE      Objective:    BP 126/64 (BP Location: Left Arm)   Pulse 84   Temp 98.1 F (36.7 C) (Temporal)   Ht 5\' 11"  (1.803 m)   Wt 230 lb (104.3 kg)   BMI 32.08 kg/m    Physical Exam Vitals and nursing note reviewed.  Constitutional:       General: He is not in acute distress.    Appearance: Normal appearance. He is well-developed. He is not diaphoretic.  HENT:  Head: Normocephalic and atraumatic.     Right Ear: External ear normal.     Left Ear: External ear normal.  Eyes:     General: No scleral icterus.    Conjunctiva/sclera: Conjunctivae normal.  Neck:     Thyroid: No thyromegaly.  Cardiovascular:     Rate and Rhythm: Normal rate and regular rhythm.     Heart sounds: Normal heart sounds. No murmur.  Pulmonary:     Effort: Pulmonary effort is normal. No respiratory distress.     Breath sounds: Normal breath sounds. No wheezing or rales.  Abdominal:     General: There is no distension.     Palpations: Abdomen is soft.     Tenderness: There is no abdominal tenderness. There is no guarding or rebound.  Musculoskeletal:     Cervical back: Neck supple.     Right lower leg: No edema.     Left lower leg: No edema.     Comments: RLE weakness at baseline  Lymphadenopathy:     Cervical: No cervical adenopathy.  Skin:    General: Skin is warm and dry.     Findings: No rash.  Neurological:     Mental Status: He is alert and oriented to person, place, and time. Mental status is at baseline.  Psychiatric:        Mood and Affect: Mood normal.        Behavior: Behavior normal.        Thought Content: Thought content normal.      No results found for any visits on 06/27/19.    Assessment & Plan:    Problem List Items Addressed This Visit      Cardiovascular and Mediastinum   Essential hypertension - Primary    Well controlled Continue current medications Recheck metabolic panel F/u in 6 months      Relevant Orders   CBC with Differential/Platelet   Comprehensive metabolic panel   Lipid panel     Endocrine   Hypothyroidism    Stable, Recheck TSH today.  Will change dose pending lab results.       Relevant Orders   CBC with Differential/Platelet   TSH     Nervous and Auditory   Post-polio  syndrome    Right leg weakness secondary to post polio syndrome\ Stable Has been followed by [facility] for bracing and assistive devices Riding powered scooter today        Other   History of colon cancer    Referral to GI for ongoing management.  Discussed that at some point the risk of colonoscopy will outweigh the benefits, but he will need to have that discussion with GI      Relevant Orders   Ambulatory referral to Gastroenterology   Hyperglycemia    Blood sugar elevated at last check.   Will check A1C today.      Relevant Orders   Lipid panel   Hemoglobin A1c       Return in about 6 months (around 12/27/2019) for Chronic Issues.     I, Lavon Paganini, MD, have reviewed all documentation for this visit. The documentation on 06/27/19 for the exam, diagnosis, procedures, and orders are all accurate and complete.   Shemeka Wardle, Dionne Bucy, MD, MPH [facility]

## 2019-06-27 ENCOUNTER — Encounter: Payer: Self-pay | Admitting: Family Medicine

## 2019-06-27 ENCOUNTER — Other Ambulatory Visit: Payer: Self-pay

## 2019-06-27 ENCOUNTER — Ambulatory Visit (INDEPENDENT_AMBULATORY_CARE_PROVIDER_SITE_OTHER): Payer: Medicare Other | Admitting: Family Medicine

## 2019-06-27 ENCOUNTER — Ambulatory Visit (INDEPENDENT_AMBULATORY_CARE_PROVIDER_SITE_OTHER): Payer: Medicare Other

## 2019-06-27 VITALS — BP 126/64 | HR 84 | Temp 98.1°F | Ht 71.0 in | Wt 230.0 lb

## 2019-06-27 VITALS — BP 126/64 | HR 83 | Temp 98.1°F | Ht 72.0 in | Wt 230.0 lb

## 2019-06-27 DIAGNOSIS — R739 Hyperglycemia, unspecified: Secondary | ICD-10-CM

## 2019-06-27 DIAGNOSIS — Z85038 Personal history of other malignant neoplasm of large intestine: Secondary | ICD-10-CM | POA: Diagnosis not present

## 2019-06-27 DIAGNOSIS — Z1211 Encounter for screening for malignant neoplasm of colon: Secondary | ICD-10-CM | POA: Diagnosis not present

## 2019-06-27 DIAGNOSIS — Z0000 Encounter for general adult medical examination without abnormal findings: Secondary | ICD-10-CM

## 2019-06-27 DIAGNOSIS — I10 Essential (primary) hypertension: Secondary | ICD-10-CM

## 2019-06-27 DIAGNOSIS — E039 Hypothyroidism, unspecified: Secondary | ICD-10-CM | POA: Diagnosis not present

## 2019-06-27 DIAGNOSIS — G14 Postpolio syndrome: Secondary | ICD-10-CM

## 2019-06-27 NOTE — Assessment & Plan Note (Signed)
Stable, Recheck TSH today.  Will change dose pending lab results.

## 2019-06-27 NOTE — Assessment & Plan Note (Signed)
Well controlled Continue current medications Recheck metabolic panel F/u in 6 months  

## 2019-06-27 NOTE — Assessment & Plan Note (Signed)
Right leg weakness secondary to post polio syndrome\ Stable Has been followed by [facility] PM[facility] for bracing and assistive devices Riding powered scooter today

## 2019-06-27 NOTE — Assessment & Plan Note (Addendum)
Referral to GI for ongoing management.  Discussed that at some point the risk of colonoscopy will outweigh the benefits, but he will need to have that discussion with GI

## 2019-06-27 NOTE — Patient Instructions (Signed)

## 2019-06-27 NOTE — Assessment & Plan Note (Signed)
Blood sugar elevated at last check.   Will check A1C today.

## 2019-06-27 NOTE — Patient Instructions (Signed)
Mr. Samuel Jones , Thank you for taking time to come for your Medicare Wellness Visit. I appreciate your ongoing commitment to your health goals. Please review the following plan we discussed and let me know if I can assist you in the future.   Screening recommendations/referrals: Colonoscopy: Referral to GI placed today. Pt aware the office will call re: appt. Recommended yearly ophthalmology/optometry visit for glaucoma screening and checkup Recommended yearly dental visit for hygiene and checkup  Vaccinations: Influenza vaccine: Up to date Pneumococcal vaccine: Completed series Tdap vaccine: Pt declines today.  Shingles vaccine: Pt declines today.     Advanced directives: Currently on file.   Conditions/risks identified: Fall risk prevention discussed today.   Next appointment: 4:00 PM today with Dr Brita Romp   Preventive Care 84 Years and Older, Male Preventive care refers to lifestyle choices and visits with your health care provider that can promote health and wellness. What does preventive care include?  A yearly physical exam. This is also called an annual well check.  Dental exams once or twice a year.  Routine eye exams. Ask your health care provider how often you should have your eyes checked.  Personal lifestyle choices, including:  Daily care of your teeth and gums.  Regular physical activity.  Eating a healthy diet.  Avoiding tobacco and drug use.  Limiting alcohol use.  Practicing safe sex.  Taking low doses of aspirin every day.  Taking vitamin and mineral supplements as recommended by your health care provider. What happens during an annual well check? The services and screenings done by your health care provider during your annual well check will depend on your age, overall health, lifestyle risk factors, and family history of disease. Counseling  Your health care provider may ask you questions about your:  Alcohol use.  Tobacco use.  Drug  use.  Emotional well-being.  Home and relationship well-being.  Sexual activity.  Eating habits.  History of falls.  Memory and ability to understand (cognition).  Work and work Statistician. Screening  You may have the following tests or measurements:  Height, weight, and BMI.  Blood pressure.  Lipid and cholesterol levels. These may be checked every 5 years, or more frequently if you are over 74 years old.  Skin check.  Lung cancer screening. You may have this screening every year starting at age 85 if you have a 30-pack-year history of smoking and currently smoke or have quit within the past 15 years.  Fecal occult blood test (FOBT) of the stool. You may have this test every year starting at age 36.  Flexible sigmoidoscopy or colonoscopy. You may have a sigmoidoscopy every 5 years or a colonoscopy every 10 years starting at age 58.  Prostate cancer screening. Recommendations will vary depending on your family history and other risks.  Hepatitis C blood test.  Hepatitis B blood test.  Sexually transmitted disease (STD) testing.  Diabetes screening. This is done by checking your blood sugar (glucose) after you have not eaten for a while (fasting). You may have this done every 1-3 years.  Abdominal aortic aneurysm (AAA) screening. You may need this if you are a current or former smoker.  Osteoporosis. You may be screened starting at age 47 if you are at high risk. Talk with your health care provider about your test results, treatment options, and if necessary, the need for more tests. Vaccines  Your health care provider may recommend certain vaccines, such as:  Influenza vaccine. This is recommended every year.  Tetanus, diphtheria, and acellular pertussis (Tdap, Td) vaccine. You may need a Td booster every 10 years.  Zoster vaccine. You may need this after age 11.  Pneumococcal 13-valent conjugate (PCV13) vaccine. One dose is recommended after age  6.  Pneumococcal polysaccharide (PPSV23) vaccine. One dose is recommended after age 69. Talk to your health care provider about which screenings and vaccines you need and how often you need them. This information is not intended to replace advice given to you by your health care provider. Make sure you discuss any questions you have with your health care provider. Document Released: 03/23/2015 Document Revised: 11/14/2015 Document Reviewed: 12/26/2014 Elsevier Interactive Patient Education  2017 [place] Prevention in the Home Falls can cause injuries. They can happen to people of all ages. There are many things you can do to make your home safe and to help prevent falls. What can I do on the outside of my home?  Regularly fix the edges of walkways and driveways and fix any cracks.  Remove anything that might make you trip as you walk through a door, such as a raised step or threshold.  Trim any bushes or trees on the path to your home.  Use bright outdoor lighting.  Clear any walking paths of anything that might make someone trip, such as rocks or tools.  Regularly check to see if handrails are loose or broken. Make sure that both sides of any steps have handrails.  Any raised decks and porches should have guardrails on the edges.  Have any leaves, snow, or ice cleared regularly.  Use sand or salt on walking paths during winter.  Clean up any spills in your garage right away. This includes oil or grease spills. What can I do in the bathroom?  Use night lights.  Install grab bars by the toilet and in the tub and shower. Do not use towel bars as grab bars.  Use non-skid mats or decals in the tub or shower.  If you need to sit down in the shower, use a plastic, non-slip stool.  Keep the floor dry. Clean up any water that spills on the floor as soon as it happens.  Remove soap buildup in the tub or shower regularly.  Attach bath mats securely with double-sided  non-slip rug tape.  Do not have throw rugs and other things on the floor that can make you trip. What can I do in the bedroom?  Use night lights.  Make sure that you have a light by your bed that is easy to reach.  Do not use any sheets or blankets that are too big for your bed. They should not hang down onto the floor.  Have a firm chair that has side arms. You can use this for support while you get dressed.  Do not have throw rugs and other things on the floor that can make you trip. What can I do in the kitchen?  Clean up any spills right away.  Avoid walking on wet floors.  Keep items that you use a lot in easy-to-reach places.  If you need to reach something above you, use a strong step stool that has a grab bar.  Keep electrical cords out of the way.  Do not use floor polish or wax that makes floors slippery. If you must use wax, use non-skid floor wax.  Do not have throw rugs and other things on the floor that can make you trip. What can I do  with my stairs?  Do not leave any items on the stairs.  Make sure that there are handrails on both sides of the stairs and use them. Fix handrails that are broken or loose. Make sure that handrails are as long as the stairways.  Check any carpeting to make sure that it is firmly attached to the stairs. Fix any carpet that is loose or worn.  Avoid having throw rugs at the top or bottom of the stairs. If you do have throw rugs, attach them to the floor with carpet tape.  Make sure that you have a light switch at the top of the stairs and the bottom of the stairs. If you do not have them, ask someone to add them for you. What else can I do to help prevent falls?  Wear shoes that:  Do not have high heels.  Have rubber bottoms.  Are comfortable and fit you well.  Are closed at the toe. Do not wear sandals.  If you use a stepladder:  Make sure that it is fully opened. Do not climb a closed stepladder.  Make sure that both  sides of the stepladder are locked into place.  Ask someone to hold it for you, if possible.  Clearly mark and make sure that you can see:  Any grab bars or handrails.  First and last steps.  Where the edge of each step is.  Use tools that help you move around (mobility aids) if they are needed. These include:  Canes.  Walkers.  Scooters.  Crutches.  Turn on the lights when you go into a dark area. Replace any light bulbs as soon as they burn out.  Set up your furniture so you have a clear path. Avoid moving your furniture around.  If any of your floors are uneven, fix them.  If there are any pets around you, be aware of where they are.  Review your medicines with your doctor. Some medicines can make you feel dizzy. This can increase your chance of falling. Ask your doctor what other things that you can do to help prevent falls. This information is not intended to replace advice given to you by your health care provider. Make sure you discuss any questions you have with your health care provider. Document Released: 12/21/2008 Document Revised: 08/02/2015 Document Reviewed: 03/31/2014 Elsevier Interactive Patient Education  2017 [employer].

## 2019-06-28 ENCOUNTER — Telehealth: Payer: Self-pay

## 2019-06-28 LAB — CBC WITH DIFFERENTIAL/PLATELET
Basophils Absolute: 0.1 10*3/uL (ref 0.0–0.2)
Basos: 1 %
EOS (ABSOLUTE): 0.2 10*3/uL (ref 0.0–0.4)
Eos: 3 %
Hematocrit: 47.6 % (ref 37.5–51.0)
Hemoglobin: 16.3 g/dL (ref 13.0–17.7)
Immature Grans (Abs): 0 10*3/uL (ref 0.0–0.1)
Immature Granulocytes: 0 %
Lymphocytes Absolute: 1.4 10*3/uL (ref 0.7–3.1)
Lymphs: 21 %
MCH: 31.9 pg (ref 26.6–33.0)
MCHC: 34.2 g/dL (ref 31.5–35.7)
MCV: 93 fL (ref 79–97)
Monocytes Absolute: 0.5 10*3/uL (ref 0.1–0.9)
Monocytes: 8 %
Neutrophils Absolute: 4.4 10*3/uL (ref 1.4–7.0)
Neutrophils: 67 %
Platelets: 238 10*3/uL (ref 150–450)
RBC: 5.11 x10E6/uL (ref 4.14–5.80)
RDW: 13.4 % (ref 11.6–15.4)
WBC: 6.5 10*3/uL (ref 3.4–10.8)

## 2019-06-28 LAB — COMPREHENSIVE METABOLIC PANEL
ALT: 26 IU/L (ref 0–44)
AST: 32 IU/L (ref 0–40)
Albumin/Globulin Ratio: 1.7 (ref 1.2–2.2)
Albumin: 4.6 g/dL (ref 3.6–4.6)
Alkaline Phosphatase: 91 IU/L (ref 39–117)
BUN/Creatinine Ratio: 21 (ref 10–24)
BUN: 14 mg/dL (ref 8–27)
Bilirubin Total: 0.5 mg/dL (ref 0.0–1.2)
CO2: 25 mmol/L (ref 20–29)
Calcium: 9.9 mg/dL (ref 8.6–10.2)
Chloride: 102 mmol/L (ref 96–106)
Creatinine, Ser: 0.67 mg/dL — ABNORMAL LOW (ref 0.76–1.27)
GFR calc Af Amer: 103 mL/min/{1.73_m2} (ref >59)
GFR calc non Af Amer: 89 mL/min/{1.73_m2} (ref >59)
Globulin, Total: 2.7 g/dL (ref 1.5–4.5)
Glucose: 97 mg/dL (ref 65–99)
Potassium: 4.9 mmol/L (ref 3.5–5.2)
Sodium: 141 mmol/L (ref 134–144)
Total Protein: 7.3 g/dL (ref 6.0–8.5)

## 2019-06-28 LAB — LIPID PANEL
Chol/HDL Ratio: 3 ratio (ref 0.0–5.0)
Cholesterol, Total: 186 mg/dL (ref 100–199)
HDL: 63 mg/dL (ref >39)
LDL Chol Calc (NIH): 101 mg/dL — ABNORMAL HIGH (ref 0–99)
Triglycerides: 125 mg/dL (ref 0–149)
VLDL Cholesterol Cal: 22 mg/dL (ref 5–40)

## 2019-06-28 LAB — TSH: TSH: 2.48 u[IU]/mL (ref 0.450–4.500)

## 2019-06-28 LAB — HEMOGLOBIN A1C
Est. average glucose Bld gHb Est-mCnc: 111 mg/dL
Hgb A1c MFr Bld: 5.5 % (ref 4.8–5.6)

## 2019-06-28 NOTE — Telephone Encounter (Signed)
-----   Message from Virginia Crews, MD sent at 06/28/2019 11:55 AM EDT ----- Normal labs

## 2019-06-28 NOTE — Telephone Encounter (Signed)
LMTCB 06/28/2019.  PEC please advise pt of lab results below.  (Pt's wife Nasim Walker also has lab results.)  Thanks,   -Mickel Baas

## 2019-06-29 ENCOUNTER — Encounter: Payer: Self-pay | Admitting: Gastroenterology

## 2019-06-29 ENCOUNTER — Telehealth (INDEPENDENT_AMBULATORY_CARE_PROVIDER_SITE_OTHER): Payer: Medicare Other | Admitting: Gastroenterology

## 2019-06-29 DIAGNOSIS — Z85038 Personal history of other malignant neoplasm of large intestine: Secondary | ICD-10-CM | POA: Diagnosis not present

## 2019-06-29 NOTE — Telephone Encounter (Signed)
Phone call to pt.  Spoke with pt's. Wife. (on DPR)  Advised that pt's recent labs were normal.  Verb. Understanding.

## 2019-06-30 NOTE — Progress Notes (Signed)
Samuel Jones [address]  [place]  [place], [place] [address]  Main: [phone]  Fax: [phone]   Gastroenterology Consultation  Referring Provider:     Virginia Crews, MD Primary Care Physician:  Virginia Crews, MD Reason for Consultation:     History of colon cancer        HPI:   Virtual Visit via Telephone Note  I connected with patient on 06/30/19 at  2:35 PM EDT by telephone and verified that I am speaking with the correct person using two identifiers.   I discussed the limitations, risks, security and privacy concerns of performing an evaluation and management service by telephone and the availability of in person appointments. I also discussed with the patient that there may be a patient responsible charge related to this service. The patient expressed understanding and agreed to proceed.  Location of the patient: Home Location of provider: Home Participating persons: Patient, his wife and provider only   History of Present Illness: Chief Complaint  Patient presents with  . New Patient (Initial Visit)  . Colonoscopy    Patient states 7 years ago he colon cancers that was stage 1     Samuel Jones is a 86 y.o. y/o male referred for consultation & management  by Dr. Brita Romp, Dionne Bucy, MD.  Patient has history of colon cancer years ago, stage I, treated with surgery alone.  Surgery reports not available.  He is being referred to discuss colonoscopy given his advanced age.  Last colonoscopy, 2015 with patent end-to-end anastomosis seen.  3 polyps were removed.  Surveillance was recommended in 3 years.  Pathology showed tubular adenoma and hyperplastic polyps.  The patient denies abdominal or flank pain, anorexia, nausea or vomiting, dysphagia, change in bowel habits or black or bloody stools or weight loss.   Past Medical History:  Diagnosis Date  . Anxiety   . Cataract   . Colon cancer ([place]) 2012  . Gout   . Hyperlipidemia   .  Hypertension   . Melanoma ([place])   . Oxygen deficiency   . Post-polio syndrome   . Stroke ([place])   . Thyroid disease     Past Surgical History:  Procedure Laterality Date  . APPENDECTOMY    . CARPAL TUNNEL RELEASE     R wrist  . CATARACT EXTRACTION Bilateral   . CHOLECYSTECTOMY    . COLON SURGERY    . FRACTURE SURGERY     R knee and L ankle  . HERNIA REPAIR    . MELANOMA EXCISION      Prior to Admission medications   Medication Sig Start Date End Date Taking? Authorizing Provider  acetaminophen (TYLENOL) 325 MG tablet Take 650 mg by mouth every 6 (six) hours as needed.   Yes [provider]  allopurinol (ZYLOPRIM) 300 MG tablet TAKE 1 TABLET BY MOUTH EVERY DAY 12/28/18  Yes Bacigalupo, Dionne Bucy, MD  aspirin EC 81 MG tablet Take 81 mg by mouth daily.   Yes [provider]  atenolol (TENORMIN) 25 MG tablet TAKE 1 TABLET BY MOUTH EVERY DAY 12/28/18  Yes Bacigalupo, Dionne Bucy, MD  clobetasol (TEMOVATE) 0.05 % external solution Apply 1 application topically 2 (two) times daily.   Yes [provider]  colchicine 0.6 MG tablet TAKE 1 TABLET BY MOUTH EVERY DAY 01/18/18  Yes Bacigalupo, Dionne Bucy, MD  donepezil (ARICEPT) 10 MG tablet TAKE 1 TABLET BY MOUTH EVERY DAY IN THE EVENING 03/15/19  Yes  Virginia Crews, MD  doxycycline (DORYX) 100 MG EC tablet Take 100 mg by mouth 2 (two) times daily.   Yes [provider]  FLUoxetine (PROZAC) 20 MG tablet Take 20 mg by mouth daily.   Yes [provider]  fluticasone (FLONASE) 50 MCG/ACT nasal spray Place into both nostrils daily.   Yes [provider]  ketoconazole (NIZORAL) 2 % shampoo Apply 1 application topically 2 (two) times a week.   Yes [provider]  levothyroxine (SYNTHROID) 100 MCG tablet TAKE 1 TABLET BY MOUTH EVERY DAY BEFORE BREAKFAST 11/16/18  Yes Bacigalupo, Dionne Bucy, MD  metroNIDAZOLE (METROGEL) 0.75 % gel APPLY A SMALL AMOUNT TO AFFECTED AREA EVERY EVENING 05/07/19  Yes  [provider]  Multiple Vitamin (MULTIVITAMIN) capsule Take 1 capsule by mouth daily.   Yes [provider]  Multiple Vitamins-Minerals (OCUVITE PO) Take 120 mg by mouth daily.   Yes [provider]  Multiple Vitamins-Minerals (PRESERVISION AREDS PO) Take by mouth.   Yes [provider]  NIFEdipine (ADALAT CC) 30 MG 24 hr tablet TAKE 2 TABLETS BY MOUTH DAILY 04/30/19  Yes Bacigalupo, Dionne Bucy, MD  nystatin-triamcinolone (MYCOLOG II) cream Apply 1 application topically 2 (two) times daily.   Yes [provider]  Omega-3 Fatty Acids (FISH OIL) 1000 MG CAPS Take 3 capsules by mouth daily.   Yes [provider]  OXYGEN Inhale into the lungs. 4 Liters O2 nightly   Yes [provider]  triamcinolone ointment (KENALOG) 0.5 % Apply 1 application topically 2 (two) times daily. For rash on L ankle 05/27/18  Yes Bacigalupo, Dionne Bucy, MD  clotrimazole (LOTRIMIN) 1 % cream Apply 1 application topically 2 (two) times daily. To rash on R thigh Patient not taking: Reported on 06/29/2019 05/27/18   Virginia Crews, MD    Family History  Problem Relation Age of Onset  . Diabetes Mother      Social History   Tobacco Use  . Smoking status: Former Smoker    Types: Cigarettes    Quit date: 03/09/1974    Years since quitting: 45.3  . Smokeless tobacco: Never Used  Substance Use Topics  . Alcohol use: Never  . Drug use: Never    Allergies as of 06/29/2019  . (No Known Allergies)    Review of Systems:    All systems reviewed and negative except where noted in HPI.   Observations/Objective:  Labs: CBC    Component Value Date/Time   WBC 6.5 06/27/2019 1616   RBC 5.11 06/27/2019 1616   HGB 16.3 06/27/2019 1616   HCT 47.6 06/27/2019 1616   PLT 238 06/27/2019 1616   MCV 93 06/27/2019 1616   MCH 31.9 06/27/2019 1616   MCHC 34.2 06/27/2019 1616   RDW 13.4 06/27/2019 1616   LYMPHSABS 1.4 06/27/2019 1616   EOSABS 0.2 06/27/2019  1616   BASOSABS 0.1 06/27/2019 1616   CMP     Component Value Date/Time   NA 141 06/27/2019 1616   K 4.9 06/27/2019 1616   CL 102 06/27/2019 1616   CO2 25 06/27/2019 1616   GLUCOSE 97 06/27/2019 1616   BUN 14 06/27/2019 1616   CREATININE 0.67 (L) 06/27/2019 1616   CALCIUM 9.9 06/27/2019 1616   PROT 7.3 06/27/2019 1616   ALBUMIN 4.6 06/27/2019 1616   AST 32 06/27/2019 1616   ALT 26 06/27/2019 1616   ALKPHOS 91 06/27/2019 1616   BILITOT 0.5 06/27/2019 1616   GFRNONAA 89 06/27/2019 1616  GFRAA 103 06/27/2019 1616    Imaging Studies: No results found.  Assessment and Plan:   Samuel Jones is a 86 y.o. y/o male has been referred for history of colon cancer  Assessment and Plan: I discussed with the patient and his wife that in his case, colonoscopy would be recommended for history of polyps and colon cancer.  There are no clear-cut guidelines as to the age.  In colonoscopies in patients with history of colon cancer or for polyp surveillance.  The guidance only exists for screening colonoscopies, 2 end of age 43.  In his case it would not be an average risk screening colonoscopy given his previous history with colon cancer  We discussed risks and benefits of colonoscopy in detail, including the risk of colon cancer for colonoscopy if not done.  He understands these risks and refuses to schedule colonoscopy at this time.  Him and his wife will call us back if they change their mind  Follow Up Instructions:   I discussed the assessment and treatment plan with the patient. The patient was provided an opportunity to ask questions and all were answered. The patient agreed with the plan and demonstrated an understanding of the instructions.   The patient was advised to call back or seek an in-person evaluation if the symptoms worsen or if the condition fails to improve as anticipated.  I provided 15 minutes of non-face-to-face time during this encounter.   Virgel Manifold,  MD  Speech recognition software was used to dictate the above note.

## 2019-07-03 ENCOUNTER — Other Ambulatory Visit: Payer: Self-pay | Admitting: Family Medicine

## 2019-07-11 DIAGNOSIS — H26493 Other secondary cataract, bilateral: Secondary | ICD-10-CM | POA: Diagnosis not present

## 2019-07-27 DIAGNOSIS — H26493 Other secondary cataract, bilateral: Secondary | ICD-10-CM | POA: Diagnosis not present

## 2019-08-25 DIAGNOSIS — H353132 Nonexudative age-related macular degeneration, bilateral, intermediate dry stage: Secondary | ICD-10-CM | POA: Diagnosis not present

## 2019-09-29 DIAGNOSIS — H903 Sensorineural hearing loss, bilateral: Secondary | ICD-10-CM | POA: Diagnosis not present

## 2019-11-01 DIAGNOSIS — H903 Sensorineural hearing loss, bilateral: Secondary | ICD-10-CM | POA: Diagnosis not present

## 2019-11-12 ENCOUNTER — Other Ambulatory Visit: Payer: Self-pay | Admitting: Family Medicine

## 2019-11-12 NOTE — Telephone Encounter (Signed)
Requested Prescriptions  Pending Prescriptions Disp Refills  . levothyroxine (SYNTHROID) 100 MCG tablet [Pharmacy Med Name: LEVOTHYROXINE 100 MCG TABLET] 90 tablet 3    Sig: TAKE 1 TABLET BY MOUTH EVERY DAY BEFORE BREAKFAST     Endocrinology:  Hypothyroid Agents Failed - 11/12/2019  9:19 AM      Failed - TSH needs to be rechecked within 3 months after an abnormal result. Refill until TSH is due.      Passed - TSH in normal range and within 360 days    TSH  Date Value Ref Range Status  06/27/2019 2.480 0.450 - 4.500 uIU/mL Final         Passed - Valid encounter within last 12 months    Recent Outpatient Visits          4 months ago Essential hypertension   [place], Dionne Bucy, MD   4 months ago Encounter for [employer] annual wellness exam   [facility], [place], LPN   11 months ago Cellulitis of left lower extremity   [facility] [place], [place], [place]   1 year ago Cellulitis of left lower extremity   [facility] [place], Dionne Bucy, MD   1 year ago Cellulitis of left lower extremity   [facility] Bacigalupo, Dionne Bucy, MD      Future Appointments            In 1 month Bacigalupo, Dionne Bucy, MD [facility], [place]

## 2019-11-13 ENCOUNTER — Other Ambulatory Visit: Payer: Self-pay | Admitting: Family Medicine

## 2019-11-13 NOTE — Telephone Encounter (Signed)
Requested Prescriptions  Pending Prescriptions Disp Refills   allopurinol (ZYLOPRIM) 300 MG tablet [Pharmacy Med Name: ALLOPURINOL 300 MG TABLET] 90 tablet 1    Sig: TAKE 1 TABLET BY MOUTH EVERY DAY     Endocrinology:  Gout Agents Failed - 11/13/2019  3:33 PM      Failed - Uric Acid in normal range and within 360 days    Uric Acid  Date Value Ref Range Status  11/25/2017 4.5 3.7 - 8.6 mg/dL Final    Comment:               Therapeutic target for gout patients: <6.0         Failed - Cr in normal range and within 360 days    Creatinine, Ser  Date Value Ref Range Status  06/27/2019 0.67 (L) 0.76 - 1.27 mg/dL Final         Passed - Valid encounter within last 12 months    Recent Outpatient Visits          4 months ago Essential hypertension   [facility] [place], Dionne Bucy, MD   4 months ago Encounter for [employer] annual wellness exam   [facility], [place], LPN   11 months ago Cellulitis of left lower extremity   [facility] [place], [place], [place]   1 year ago Cellulitis of left lower extremity   [facility] [place], Dionne Bucy, MD   1 year ago Cellulitis of left lower extremity   [facility] Bacigalupo, Dionne Bucy, MD      Future Appointments            In 1 month Bacigalupo, Dionne Bucy, MD [facility]            atenolol (TENORMIN) 25 MG tablet [Pharmacy Med Name: ATENOLOL 25 MG TABLET] 90 tablet 0    Sig: TAKE 1 TABLET BY MOUTH EVERY DAY     Cardiovascular:  Beta Blockers Passed - 11/13/2019  3:33 PM      Passed - Last BP in normal range    BP Readings from Last 1 Encounters:  06/27/19 126/64         Passed - Last Heart Rate in normal range    Pulse Readings from Last 1 Encounters:  06/27/19 84         Passed - Valid encounter within last 6 months    Recent Outpatient Visits          4 months ago Essential hypertension   [place], Dionne Bucy, MD   4 months ago Encounter for [employer] annual wellness exam   [place], [place], LPN   11 months ago Cellulitis of left lower extremity   [facility] [place], [place], [place]   1 year ago Cellulitis of left lower extremity   [facility] [place], Dionne Bucy, MD   1 year ago Cellulitis of left lower extremity   [facility] Bacigalupo, Dionne Bucy, MD      Future Appointments            In 1 month Bacigalupo, Dionne Bucy, MD [address], [place]

## 2019-12-26 ENCOUNTER — Ambulatory Visit (INDEPENDENT_AMBULATORY_CARE_PROVIDER_SITE_OTHER): Payer: Medicare Other | Admitting: Family Medicine

## 2019-12-26 ENCOUNTER — Other Ambulatory Visit: Payer: Self-pay

## 2019-12-26 ENCOUNTER — Encounter: Payer: Self-pay | Admitting: Family Medicine

## 2019-12-26 VITALS — BP 116/68 | HR 60 | Resp 15 | Wt 182.0 lb

## 2019-12-26 DIAGNOSIS — Z85820 Personal history of malignant melanoma of skin: Secondary | ICD-10-CM

## 2019-12-26 DIAGNOSIS — L989 Disorder of the skin and subcutaneous tissue, unspecified: Secondary | ICD-10-CM

## 2019-12-26 DIAGNOSIS — R1319 Other dysphagia: Secondary | ICD-10-CM | POA: Diagnosis not present

## 2019-12-26 DIAGNOSIS — E039 Hypothyroidism, unspecified: Secondary | ICD-10-CM

## 2019-12-26 DIAGNOSIS — Z23 Encounter for immunization: Secondary | ICD-10-CM

## 2019-12-26 DIAGNOSIS — I10 Essential (primary) hypertension: Secondary | ICD-10-CM | POA: Diagnosis not present

## 2019-12-26 DIAGNOSIS — L89151 Pressure ulcer of sacral region, stage 1: Secondary | ICD-10-CM | POA: Diagnosis not present

## 2019-12-26 DIAGNOSIS — Z85828 Personal history of other malignant neoplasm of skin: Secondary | ICD-10-CM

## 2019-12-26 NOTE — Progress Notes (Signed)
Established patient visit   Patient: Samuel Jones   DOB: 12/14/1933   86 y.o. Male  MRN: [id number] Visit Date: 12/26/2019  Today's healthcare provider: Lavon Paganini, MD   Chief Complaint  Patient presents with  . Hypertension  . Hypothyroidism   Subjective    HPI  Hypertension, follow-up  BP Readings from Last 3 Encounters:  12/26/19 116/68  06/27/19 126/64  06/27/19 126/64   Wt Readings from Last 3 Encounters:  12/26/19 182 lb (82.6 kg)  06/27/19 230 lb (104.3 kg)  06/27/19 230 lb (104.3 kg)     He was last seen for hypertension 6 months ago.  BP at that visit was 126/64. Management since that visit includes none.  He reports excellent compliance with treatment. He is not having side effects.  He is following a Regular diet. He is not exercising. He does not smoke.  Use of agents associated with hypertension: NSAIDS.   Outside blood pressures are are not being checked. Symptoms: No chest pain No chest pressure  No palpitations No syncope  No dyspnea No orthopnea  No paroxysmal nocturnal dyspnea No lower extremity edema   Pertinent labs: Lab Results  Component Value Date   CHOL 186 06/27/2019   HDL 63 06/27/2019   LDLCALC 101 (H) 06/27/2019   TRIG 125 06/27/2019   CHOLHDL 3.0 06/27/2019   Lab Results  Component Value Date   NA 140 12/26/2019   K 4.6 12/26/2019   CREATININE 0.58 (L) 12/26/2019   GFRNONAA 94 12/26/2019   GFRAA 108 12/26/2019   GLUCOSE 90 12/26/2019     The ASCVD Risk score (Goff DC Jr., et al., 2013) failed to calculate for the following reasons:   The 2013 ASCVD risk score is only valid for ages 85 to 86   ---------------------------------------------------------------------------------------------------   Thyroid Problem Presents for 6 month  follow-up visit. Patient reports no cold intolerance, depressed mood, diarrhea, fatigue, heat intolerance, hoarse voice, nail problem, palpitations, visual change, weight gain  or weight loss. The symptoms have been stable. Patient reports good compliance and tolerance on medication  Painful spot on bottom x2-3 months  Throat is painful with swallowing x few weeks, only with solid foods  Patient Active Problem List   Diagnosis Date Noted  . Esophageal dysphagia 12/27/2019  . Pressure injury of sacral region, stage 1 12/27/2019  . Skin lesion of right lower extremity 12/27/2019  . History of melanoma 12/27/2019  . Lymphedema 12/13/2018  . Advanced care planning/counseling discussion 11/26/2017  . Gout 09/22/2017  . Post-polio syndrome 09/18/2017  . Loss of hearing 09/18/2017  . History of CVA (cerebrovascular accident) 09/18/2017  . History of colon cancer 09/18/2017  . Hypothyroidism 09/18/2017  . Essential hypertension 09/18/2017  . Cognitive impairment 09/18/2017   Past Medical History:  Diagnosis Date  . Anxiety   . Cataract   . Colon cancer ([place]) 2012  . Gout   . Hyperlipidemia   . Hypertension   . Melanoma ([place])   . Oxygen deficiency   . Post-polio syndrome   . Stroke ([place])   . Thyroid disease    No Known Allergies     Medications: Outpatient Medications Prior to Visit  Medication Sig  . acetaminophen (TYLENOL) 325 MG tablet Take 650 mg by mouth every 6 (six) hours as needed.  Marland Kitchen allopurinol (ZYLOPRIM) 300 MG tablet TAKE 1 TABLET BY MOUTH EVERY DAY  . aspirin EC 81 MG tablet Take 81 mg by mouth daily.  Marland Kitchen atenolol (TENORMIN)  25 MG tablet TAKE 1 TABLET BY MOUTH EVERY DAY  . clobetasol (TEMOVATE) 0.05 % external solution Apply 1 application topically 2 (two) times daily.  . colchicine 0.6 MG tablet TAKE 1 TABLET BY MOUTH EVERY DAY  . donepezil (ARICEPT) 10 MG tablet TAKE 1 TABLET BY MOUTH EVERY DAY IN THE EVENING  . FLUoxetine (PROZAC) 20 MG tablet Take 20 mg by mouth daily.  . fluticasone (FLONASE) 50 MCG/ACT nasal spray Place into both nostrils daily.  Marland Kitchen ketoconazole (NIZORAL) 2 % shampoo Apply 1 application topically 2 (two) times a  week.  . levothyroxine (SYNTHROID) 100 MCG tablet TAKE 1 TABLET BY MOUTH EVERY DAY BEFORE BREAKFAST  . Multiple Vitamin (MULTIVITAMIN) capsule Take 1 capsule by mouth daily.  . Multiple Vitamins-Minerals (OCUVITE PO) Take 120 mg by mouth daily.  . Multiple Vitamins-Minerals (PRESERVISION AREDS PO) Take by mouth.  Marland Kitchen NIFEdipine (ADALAT CC) 30 MG 24 hr tablet TAKE 2 TABLETS BY MOUTH DAILY  . nystatin-triamcinolone (MYCOLOG II) cream Apply 1 application topically 2 (two) times daily.  . Omega-3 Fatty Acids (FISH OIL) 1000 MG CAPS Take 3 capsules by mouth daily.  . OXYGEN Inhale into the lungs. 4 Liters O2 nightly  . triamcinolone ointment (KENALOG) 0.5 % Apply 1 application topically 2 (two) times daily. For rash on L ankle  . [DISCONTINUED] clotrimazole (LOTRIMIN) 1 % cream Apply 1 application topically 2 (two) times daily. To rash on R thigh (Patient not taking: Reported on 06/29/2019)  . [DISCONTINUED] doxycycline (DORYX) 100 MG EC tablet Take 100 mg by mouth 2 (two) times daily. (Patient not taking: Reported on 12/26/2019)  . [DISCONTINUED] metroNIDAZOLE (METROGEL) 0.75 % gel APPLY A SMALL AMOUNT TO AFFECTED AREA EVERY EVENING (Patient not taking: Reported on 12/26/2019)   No facility-administered medications prior to visit.    Review of Systems  Constitutional: Negative.   HENT: Positive for trouble swallowing.   Respiratory: Negative.   Cardiovascular: Negative.   Gastrointestinal: Negative for abdominal pain, blood in stool, constipation, diarrhea, nausea, rectal pain and vomiting.  Skin: Positive for color change, rash and wound.  Neurological: Negative.        Objective    BP 116/68   Pulse 60   Resp 15   Wt 182 lb (82.6 kg) Comment: patient reports  BMI 25.38 kg/m  BP Readings from Last 3 Encounters:  12/26/19 116/68  06/27/19 126/64  06/27/19 126/64   Wt Readings from Last 3 Encounters:  12/26/19 182 lb (82.6 kg)  06/27/19 230 lb (104.3 kg)  06/27/19 230 lb (104.3  kg)      Physical Exam Vitals reviewed.  Constitutional:      General: He is not in acute distress.    Appearance: Normal appearance. He is not diaphoretic.     Comments: Sitting in motorized wheelchair  HENT:     Head: Normocephalic and atraumatic.  Eyes:     General: No scleral icterus.    Conjunctiva/sclera: Conjunctivae normal.  Cardiovascular:     Rate and Rhythm: Normal rate and regular rhythm.     Pulses: Normal pulses.     Heart sounds: Normal heart sounds. No murmur heard.   Pulmonary:     Effort: Pulmonary effort is normal. No respiratory distress.     Breath sounds: Normal breath sounds. No wheezing or rhonchi.  Abdominal:     General: There is no distension.     Palpations: Abdomen is soft.     Tenderness: There is no abdominal tenderness.  Musculoskeletal:  Cervical back: Neck supple.     Right lower leg: No edema.     Left lower leg: No edema.  Lymphadenopathy:     Cervical: No cervical adenopathy.  Skin:    General: Skin is warm and dry.     Capillary Refill: Capillary refill takes less than 2 seconds.     Comments: Sacral pressure ulcer stage one with no exposed underlying tissues  Clustered area of scabbed lesions on R lateral thigh. One is raised. No erythematous base  Neurological:     Mental Status: He is alert and oriented to person, place, and time. Mental status is at baseline.  Psychiatric:        Mood and Affect: Mood normal.        Behavior: Behavior normal.     Results for orders placed or performed in visit on 12/26/19  TSH  Result Value Ref Range   TSH 1.040 0.450 - 4.500 uIU/mL  Basic Metabolic Panel (BMET)  Result Value Ref Range   Glucose 90 65 - 99 mg/dL   BUN 18 8 - 27 mg/dL   Creatinine, Ser 0.58 (L) 0.76 - 1.27 mg/dL   GFR calc non Af Amer 94 >59 mL/min/1.73   GFR calc Af Amer 108 >59 mL/min/1.73   BUN/Creatinine Ratio 31 (H) 10 - 24   Sodium 140 134 - 144 mmol/L   Potassium 4.6 3.5 - 5.2 mmol/L   Chloride 104 96 - 106  mmol/L   CO2 25 20 - 29 mmol/L   Calcium 9.9 8.6 - 10.2 mg/dL    Assessment & Plan     Problem List Items Addressed This Visit      Cardiovascular and Mediastinum   Essential hypertension    Well controlled Continue current medications Recheck metabolic panel F/u in 6 months       Relevant Orders   Basic Metabolic Panel (BMET) (Completed)     Digestive   Esophageal dysphagia    New problem over the last few months Only occurring with solid foods No trouble swallowing, just feels tight as it passes through his esophagus in his chest Referral back to GI to consider possible EGD Discussed taking small bites and chewing well prior to swallowing Discussed return precautions      Relevant Orders   Ambulatory referral to Gastroenterology     Endocrine   Hypothyroidism - Primary    Previously well controlled Continue Synthroid at current dose  Recheck TSH and adjust Synthroid as indicated        Relevant Orders   TSH (Completed)     Musculoskeletal and Integument   Pressure injury of sacral region, stage 1    New problem Discussed importance of position changing and pressure offloading while he is in his wheelchair Referral to home health RN for wound management and dressing changes      Relevant Orders   Ambulatory referral to Home Health   Skin lesion of right lower extremity    Lesions of right lateral thigh look consistent with healing zoster rash There is one more raised area that looks like possible granulation formation Given his history of skin cancer, he likely needs annual full-body skin exam anyways, so referral to Derm placed today Discussed that if this area is not clearing up, should discuss it with dermatology      Relevant Orders   Ambulatory referral to Dermatology     Other   History of melanoma    Given patient's history of melanoma,  referral to dermatology for annual skin exams       Other Visit Diagnoses    Need for influenza  vaccination       Relevant Orders   Flu Vaccine QUAD High Dose(Fluad) (Completed)       Return in about 6 months (around 06/25/2020) for CPE, AWV.       Total time spent on today's visit was greater than 40 minutes, including both face-to-face time and nonface-to-face time personally spent on review of chart (labs and imaging), discussing labs and goals, discussing further work-up, treatment options, referrals to specialist if needed, reviewing outside records of pertinent, answering patient's questions, and coordinating care.    I, Lavon Paganini, MD, have reviewed all documentation for this visit. The documentation on 12/27/19 for the exam, diagnosis, procedures, and orders are all accurate and complete.   Aleicia Kenagy, Dionne Bucy, MD, MPH [facility]

## 2019-12-27 ENCOUNTER — Telehealth: Payer: Self-pay

## 2019-12-27 DIAGNOSIS — Z85820 Personal history of malignant melanoma of skin: Secondary | ICD-10-CM | POA: Insufficient documentation

## 2019-12-27 DIAGNOSIS — L89151 Pressure ulcer of sacral region, stage 1: Secondary | ICD-10-CM | POA: Insufficient documentation

## 2019-12-27 DIAGNOSIS — L989 Disorder of the skin and subcutaneous tissue, unspecified: Secondary | ICD-10-CM | POA: Insufficient documentation

## 2019-12-27 DIAGNOSIS — R1319 Other dysphagia: Secondary | ICD-10-CM | POA: Insufficient documentation

## 2019-12-27 LAB — BASIC METABOLIC PANEL
BUN/Creatinine Ratio: 31 — ABNORMAL HIGH (ref 10–24)
BUN: 18 mg/dL (ref 8–27)
CO2: 25 mmol/L (ref 20–29)
Calcium: 9.9 mg/dL (ref 8.6–10.2)
Chloride: 104 mmol/L (ref 96–106)
Creatinine, Ser: 0.58 mg/dL — ABNORMAL LOW (ref 0.76–1.27)
GFR calc Af Amer: 108 mL/min/{1.73_m2} (ref >59)
GFR calc non Af Amer: 94 mL/min/{1.73_m2} (ref >59)
Glucose: 90 mg/dL (ref 65–99)
Potassium: 4.6 mmol/L (ref 3.5–5.2)
Sodium: 140 mmol/L (ref 134–144)

## 2019-12-27 LAB — TSH: TSH: 1.04 u[IU]/mL (ref 0.450–4.500)

## 2019-12-27 NOTE — Assessment & Plan Note (Signed)
New problem Discussed importance of position changing and pressure offloading while he is in his wheelchair Referral to home health RN for wound management and dressing changes

## 2019-12-27 NOTE — Assessment & Plan Note (Signed)
New problem over the last few months Only occurring with solid foods No trouble swallowing, just feels tight as it passes through his esophagus in his chest Referral back to GI to consider possible EGD Discussed taking small bites and chewing well prior to swallowing Discussed return precautions

## 2019-12-27 NOTE — Assessment & Plan Note (Signed)
Previously well controlled Continue Synthroid at current dose  Recheck TSH and adjust Synthroid as indicated   

## 2019-12-27 NOTE — Telephone Encounter (Signed)
Pt advised.   Thanks,   -Jairo Bellew  

## 2019-12-27 NOTE — Assessment & Plan Note (Signed)
Well controlled Continue current medications Recheck metabolic panel F/u in 6 months  

## 2019-12-27 NOTE — Telephone Encounter (Signed)
-----   Message from Virginia Crews, MD sent at 12/27/2019  9:03 AM EDT ----- Normal labs

## 2019-12-27 NOTE — Assessment & Plan Note (Signed)
Given patient's history of melanoma, referral to dermatology for annual skin exams

## 2019-12-27 NOTE — Assessment & Plan Note (Signed)
Lesions of right lateral thigh look consistent with healing zoster rash There is one more raised area that looks like possible granulation formation Given his history of skin cancer, he likely needs annual full-body skin exam anyways, so referral to Derm placed today Discussed that if this area is not clearing up, should discuss it with dermatology

## 2019-12-31 DIAGNOSIS — R1319 Other dysphagia: Secondary | ICD-10-CM | POA: Diagnosis not present

## 2019-12-31 DIAGNOSIS — L989 Disorder of the skin and subcutaneous tissue, unspecified: Secondary | ICD-10-CM | POA: Diagnosis not present

## 2019-12-31 DIAGNOSIS — F419 Anxiety disorder, unspecified: Secondary | ICD-10-CM | POA: Diagnosis not present

## 2019-12-31 DIAGNOSIS — I890 Lymphedema, not elsewhere classified: Secondary | ICD-10-CM | POA: Diagnosis not present

## 2019-12-31 DIAGNOSIS — Z8673 Personal history of transient ischemic attack (TIA), and cerebral infarction without residual deficits: Secondary | ICD-10-CM | POA: Diagnosis not present

## 2019-12-31 DIAGNOSIS — E039 Hypothyroidism, unspecified: Secondary | ICD-10-CM | POA: Diagnosis not present

## 2019-12-31 DIAGNOSIS — I10 Essential (primary) hypertension: Secondary | ICD-10-CM | POA: Diagnosis not present

## 2019-12-31 DIAGNOSIS — G14 Postpolio syndrome: Secondary | ICD-10-CM | POA: Diagnosis not present

## 2019-12-31 DIAGNOSIS — L89151 Pressure ulcer of sacral region, stage 1: Secondary | ICD-10-CM | POA: Diagnosis not present

## 2019-12-31 DIAGNOSIS — Z85828 Personal history of other malignant neoplasm of skin: Secondary | ICD-10-CM | POA: Diagnosis not present

## 2019-12-31 DIAGNOSIS — Z9181 History of falling: Secondary | ICD-10-CM | POA: Diagnosis not present

## 2019-12-31 DIAGNOSIS — H9190 Unspecified hearing loss, unspecified ear: Secondary | ICD-10-CM | POA: Diagnosis not present

## 2019-12-31 DIAGNOSIS — M109 Gout, unspecified: Secondary | ICD-10-CM | POA: Diagnosis not present

## 2019-12-31 DIAGNOSIS — G3184 Mild cognitive impairment, so stated: Secondary | ICD-10-CM | POA: Diagnosis not present

## 2019-12-31 DIAGNOSIS — Z85038 Personal history of other malignant neoplasm of large intestine: Secondary | ICD-10-CM | POA: Diagnosis not present

## 2019-12-31 DIAGNOSIS — Z7982 Long term (current) use of aspirin: Secondary | ICD-10-CM | POA: Diagnosis not present

## 2019-12-31 DIAGNOSIS — E785 Hyperlipidemia, unspecified: Secondary | ICD-10-CM | POA: Diagnosis not present

## 2020-01-03 ENCOUNTER — Telehealth: Payer: Self-pay | Admitting: Family Medicine

## 2020-01-03 DIAGNOSIS — E039 Hypothyroidism, unspecified: Secondary | ICD-10-CM | POA: Diagnosis not present

## 2020-01-03 DIAGNOSIS — I890 Lymphedema, not elsewhere classified: Secondary | ICD-10-CM | POA: Diagnosis not present

## 2020-01-03 DIAGNOSIS — I10 Essential (primary) hypertension: Secondary | ICD-10-CM | POA: Diagnosis not present

## 2020-01-03 DIAGNOSIS — L89151 Pressure ulcer of sacral region, stage 1: Secondary | ICD-10-CM | POA: Diagnosis not present

## 2020-01-03 DIAGNOSIS — E785 Hyperlipidemia, unspecified: Secondary | ICD-10-CM | POA: Diagnosis not present

## 2020-01-03 DIAGNOSIS — L989 Disorder of the skin and subcutaneous tissue, unspecified: Secondary | ICD-10-CM | POA: Diagnosis not present

## 2020-01-03 NOTE — Telephone Encounter (Signed)
OK for verbals 

## 2020-01-03 NOTE — Telephone Encounter (Signed)
Home Health Verbal Orders - Caller/Agency: [facility] Number: [phone]  Requesting Skilled Nursing:  verbal to go see pt 1 time a week for 4 weeks and 2 prn visits for nursing care  Wound care for healed wound to apply Vaseline or Desitin to wound

## 2020-01-03 NOTE — Telephone Encounter (Signed)
Verbal orders giving to E[place].  Thanks,   -Mickel Baas

## 2020-01-11 DIAGNOSIS — L89151 Pressure ulcer of sacral region, stage 1: Secondary | ICD-10-CM | POA: Diagnosis not present

## 2020-01-11 DIAGNOSIS — E039 Hypothyroidism, unspecified: Secondary | ICD-10-CM | POA: Diagnosis not present

## 2020-01-11 DIAGNOSIS — I890 Lymphedema, not elsewhere classified: Secondary | ICD-10-CM | POA: Diagnosis not present

## 2020-01-11 DIAGNOSIS — L989 Disorder of the skin and subcutaneous tissue, unspecified: Secondary | ICD-10-CM | POA: Diagnosis not present

## 2020-01-11 DIAGNOSIS — E785 Hyperlipidemia, unspecified: Secondary | ICD-10-CM | POA: Diagnosis not present

## 2020-01-11 DIAGNOSIS — I10 Essential (primary) hypertension: Secondary | ICD-10-CM | POA: Diagnosis not present

## 2020-01-12 ENCOUNTER — Other Ambulatory Visit: Payer: Self-pay | Admitting: Family Medicine

## 2020-01-12 ENCOUNTER — Telehealth: Payer: Self-pay

## 2020-01-12 ENCOUNTER — Telehealth: Payer: Self-pay | Admitting: *Deleted

## 2020-01-12 ENCOUNTER — Other Ambulatory Visit: Payer: Self-pay

## 2020-01-12 ENCOUNTER — Ambulatory Visit (INDEPENDENT_AMBULATORY_CARE_PROVIDER_SITE_OTHER): Payer: Medicare Other

## 2020-01-12 DIAGNOSIS — Z23 Encounter for immunization: Secondary | ICD-10-CM

## 2020-01-12 NOTE — Telephone Encounter (Signed)
Patient's wife inquiring as to why Atenolol was recently denied at the pharmacy. Last ordered 11/13/19 with #90 tabs. Explained that the request for a refill was submitted too early and when submitted nearer the refill date of 02/12/20 it will be approved at that time. Patient and wife were understood.

## 2020-01-12 NOTE — Telephone Encounter (Signed)
Ok to send refill. I think it was denied at the pharmacy because a 90 day supply was sent only 2 months ago.  He only needs to fill this every 3 months.

## 2020-01-12 NOTE — Telephone Encounter (Signed)
Copied from [place] [id number]. Topic: General - Other >> Jan 12, 2020  2:29 PM Hinda Lenis D wrote: PT was denied this medication at the pharmacy, PT was to call us to F/UP / atenolol (TENORMIN) 25 MG tablet [[id number]] / please advise [facility] [address] [place] [address] [place] [address] Phone: [phone] Fax: [phone]

## 2020-01-13 MED ORDER — ATENOLOL 25 MG PO TABS
25.0000 mg | ORAL_TABLET | Freq: Every day | ORAL | 1 refills | Status: DC
Start: 2020-01-13 — End: 2020-08-13

## 2020-01-17 DIAGNOSIS — L989 Disorder of the skin and subcutaneous tissue, unspecified: Secondary | ICD-10-CM | POA: Diagnosis not present

## 2020-01-17 DIAGNOSIS — I10 Essential (primary) hypertension: Secondary | ICD-10-CM | POA: Diagnosis not present

## 2020-01-17 DIAGNOSIS — L89151 Pressure ulcer of sacral region, stage 1: Secondary | ICD-10-CM | POA: Diagnosis not present

## 2020-01-17 DIAGNOSIS — E039 Hypothyroidism, unspecified: Secondary | ICD-10-CM | POA: Diagnosis not present

## 2020-01-17 DIAGNOSIS — I890 Lymphedema, not elsewhere classified: Secondary | ICD-10-CM | POA: Diagnosis not present

## 2020-01-17 DIAGNOSIS — E785 Hyperlipidemia, unspecified: Secondary | ICD-10-CM | POA: Diagnosis not present

## 2020-01-21 ENCOUNTER — Other Ambulatory Visit: Payer: Self-pay | Admitting: Family Medicine

## 2020-01-21 ENCOUNTER — Other Ambulatory Visit: Payer: Self-pay | Admitting: Dermatology

## 2020-01-21 NOTE — Telephone Encounter (Signed)
Requested Prescriptions  Pending Prescriptions Disp Refills   donepezil (ARICEPT) 10 MG tablet [Pharmacy Med Name: DONEPEZIL HCL 10 MG TABLET] 90 tablet 0    Sig: TAKE 1 TABLET BY MOUTH EVERY DAY IN THE EVENING     Neurology:  Alzheimer's Agents Passed - 01/21/2020 11:38 AM      Passed - Valid encounter within last 6 months    Recent Outpatient Visits          3 weeks ago Hypothyroidism, unspecified type   [facility], Dionne Bucy, MD   6 months ago Essential hypertension   [place], Dionne Bucy, MD   6 months ago Encounter for [employer] annual wellness exam   [facility] [place], [place], LPN   1 year ago Cellulitis of left lower extremity   [place], [place], [place]   1 year ago Cellulitis of left lower extremity   [facility] [place], Dionne Bucy, MD

## 2020-02-13 ENCOUNTER — Other Ambulatory Visit: Payer: Self-pay | Admitting: Dermatology

## 2020-02-20 ENCOUNTER — Other Ambulatory Visit: Payer: Self-pay

## 2020-02-20 ENCOUNTER — Ambulatory Visit (INDEPENDENT_AMBULATORY_CARE_PROVIDER_SITE_OTHER): Payer: Medicare Other | Admitting: Dermatology

## 2020-02-20 DIAGNOSIS — L309 Dermatitis, unspecified: Secondary | ICD-10-CM | POA: Diagnosis not present

## 2020-02-20 DIAGNOSIS — L578 Other skin changes due to chronic exposure to nonionizing radiation: Secondary | ICD-10-CM

## 2020-02-20 DIAGNOSIS — D1800 Hemangioma unspecified site: Secondary | ICD-10-CM

## 2020-02-20 DIAGNOSIS — D229 Melanocytic nevi, unspecified: Secondary | ICD-10-CM

## 2020-02-20 DIAGNOSIS — Z85820 Personal history of malignant melanoma of skin: Secondary | ICD-10-CM | POA: Diagnosis not present

## 2020-02-20 DIAGNOSIS — L814 Other melanin hyperpigmentation: Secondary | ICD-10-CM

## 2020-02-20 DIAGNOSIS — L821 Other seborrheic keratosis: Secondary | ICD-10-CM

## 2020-02-20 DIAGNOSIS — Z1283 Encounter for screening for malignant neoplasm of skin: Secondary | ICD-10-CM | POA: Diagnosis not present

## 2020-02-20 DIAGNOSIS — R21 Rash and other nonspecific skin eruption: Secondary | ICD-10-CM

## 2020-02-20 NOTE — Patient Instructions (Signed)

## 2020-02-20 NOTE — Progress Notes (Signed)
Follow-Up Visit   Subjective  Samuel Jones is a 86 y.o. male who presents for the following: Annual Exam (History of Melanoma about 30 years ago on his back). The patient presents for Total-Body Skin Exam (TBSE) for skin cancer screening and mole check.  The following portions of the chart were reviewed this encounter and updated as appropriate:   Tobacco  Allergies  Meds  Problems  Med Hx  Surg Hx  Fam Hx     Review of Systems:  No other skin or systemic complaints except as noted in HPI or Assessment and Plan.  Objective  Well appearing patient in no apparent distress; mood and affect are within normal limits.  A full examination was performed including scalp, head, eyes, ears, nose, lips, neck, chest, axillae, abdomen, back, buttocks, bilateral upper extremities, bilateral lower extremities, hands, feet, fingers, toes, fingernails, and toenails. All findings within normal limits unless otherwise noted below.  Objective  Face, neck: Papules scattered over face and neck  Images       Assessment & Plan    Lentigines - Scattered tan macules - Discussed due to sun exposure - Benign, observe - Call for any changes  Seborrheic Keratoses - Stuck-on, waxy, tan-brown papules and plaques  - Discussed benign etiology and prognosis. - Observe - Call for any changes  Melanocytic Nevi - Tan-brown and/or pink-flesh-colored symmetric macules and papules - Benign appearing on exam today - Observation - Call clinic for new or changing moles - Recommend daily use of broad spectrum spf 30+ sunscreen to sun-exposed areas.   Hemangiomas - Red papules - Discussed benign nature - Observe - Call for any changes  Actinic Damage - Chronic, secondary to cumulative UV/sun exposure - diffuse scaly erythematous macules with underlying dyspigmentation - Recommend daily broad spectrum sunscreen SPF 30+ to sun-exposed areas, reapply every 2 hours as needed.  - Call for new or  changing lesions.  Skin cancer screening performed today.  History of Melanoma - No evidence of recurrence today - No lymphadenopathy - Recommend regular full body skin exams - Recommend daily broad spectrum sunscreen SPF 30+ to sun-exposed areas, reapply every 2 hours as needed.  - Call if any new or changing lesions are noted between office visits  Rash Face, neck  Rosacea vs Perioral Derm vs Tinea vs other   Skin / nail biopsy - Face, neck Type of biopsy: punch   Informed consent: discussed and consent obtained   Timeout: patient name, date of birth, surgical site, and procedure verified   Procedure prep:  Patient was prepped and draped in usual sterile fashion (the patient was cleaned and prepped) Prep type:  Isopropyl alcohol Anesthesia: the lesion was anesthetized in a standard fashion   Anesthetic:  1% lidocaine w/ epinephrine 1-100,000 buffered w/ 8.4% NaHCO3 Punch size:  2.5 mm Suture size:  5-0 Suture type: Prolene (polypropylene)   Hemostasis achieved with: suture, pressure and aluminum chloride   Outcome: patient tolerated procedure well   Post-procedure details: sterile dressing applied and wound care instructions given   Dressing type: bandage, petrolatum and pressure dressing    Specimen 1 - Surgical pathology Differential Diagnosis: Rosacea vs Perioral Derm vs Tinea vs other  Check Margins: No Papules scattered over face and neck  Return in about 1 week (around 02/27/2020) for Biopsy Follow up.  I, Ashok Cordia, CMA, am acting as scribe for Sarina Ser, MD .  Documentation: I have reviewed the above documentation for accuracy and completeness, and I agree  with the above.  Sarina Ser, MD

## 2020-02-23 ENCOUNTER — Telehealth: Payer: Self-pay

## 2020-02-23 ENCOUNTER — Encounter: Payer: Self-pay | Admitting: Dermatology

## 2020-02-23 DIAGNOSIS — R21 Rash and other nonspecific skin eruption: Secondary | ICD-10-CM

## 2020-02-23 MED ORDER — MOMETASONE FUROATE 0.1 % EX CREA: TOPICAL_CREAM | CUTANEOUS | 0 refills | Status: DC

## 2020-02-23 NOTE — Telephone Encounter (Signed)
Patient and wife informed of pathology results.

## 2020-02-23 NOTE — Telephone Encounter (Signed)
-----   Message from Ralene Bathe, MD sent at 02/22/2020  6:20 PM EST ----- Diagnosis Skin , face, neck SPONGIOTIC DERMATITIS, SEE DESCRIPTION  Rash consistent with Eczema vs Contact Send in Mometasone cream to use until next appt Keep 1 week follow up

## 2020-02-27 ENCOUNTER — Other Ambulatory Visit: Payer: Self-pay | Admitting: Family Medicine

## 2020-02-27 ENCOUNTER — Other Ambulatory Visit: Payer: Self-pay | Admitting: Dermatology

## 2020-02-27 ENCOUNTER — Other Ambulatory Visit: Payer: Self-pay

## 2020-02-27 ENCOUNTER — Ambulatory Visit (INDEPENDENT_AMBULATORY_CARE_PROVIDER_SITE_OTHER): Payer: Medicare Other | Admitting: Dermatology

## 2020-02-27 DIAGNOSIS — L309 Dermatitis, unspecified: Secondary | ICD-10-CM

## 2020-02-27 NOTE — Progress Notes (Signed)
   Follow-Up Visit   Subjective  Samuel Jones is a 86 y.o. male who presents for the following: Follow-up (Biopsy proven eczema vs contact dermatitis).  The following portions of the chart were reviewed this encounter and updated as appropriate:   Tobacco  Allergies  Meds  Problems  Med Hx  Surg Hx  Fam Hx     Review of Systems:  No other skin or systemic complaints except as noted in HPI or Assessment and Plan.  Objective  Well appearing patient in no apparent distress; mood and affect are within normal limits.  A focused examination was performed including face, neck. Relevant physical exam findings are noted in the Assessment and Plan.  Objective  Neck - Anterior: Papules scattered over face and neck  Healing biopsy site of left neck   Assessment & Plan  Dermatitis Neck - Anterior Biopsy proven eczema vs contact dermatitis Most consistent with eczema/atopic dermatitis. Start Mometasone cream qd - patient is waiting on pharmacy to mail it to him.  Atopic dermatitis (eczema) is a chronic, relapsing, pruritic condition that can significantly affect quality of life. It is often associated with allergic rhinitis and/or asthma and can require treatment with topical medications, phototherapy, or in severe cases a biologic medication called Dupixent in older children and adults.   Consider patch testing in the future if not improving.  Wound cleansed, sutures removed, wound cleansed and bandage applied. Discussed pathology results.  Return in about 2 months (around 04/29/2020).   I, Ashok Cordia, CMA, am acting as scribe for Sarina Ser, MD .  Documentation: I have reviewed the above documentation for accuracy and completeness, and I agree with the above.  Sarina Ser, MD    Documentation: I have reviewed the above documentation for accuracy and completeness, and I agree with the above.  Sarina Ser, MD

## 2020-03-07 ENCOUNTER — Encounter: Payer: Self-pay | Admitting: Dermatology

## 2020-04-09 ENCOUNTER — Other Ambulatory Visit: Payer: Self-pay | Admitting: Family Medicine

## 2020-04-09 MED ORDER — COLCHICINE 0.6 MG PO TABS: 0.6000 mg | ORAL_TABLET | Freq: Every day | ORAL | 1 refills | Status: DC

## 2020-04-10 ENCOUNTER — Other Ambulatory Visit: Payer: Self-pay | Admitting: Dermatology

## 2020-04-10 DIAGNOSIS — R21 Rash and other nonspecific skin eruption: Secondary | ICD-10-CM

## 2020-04-26 DIAGNOSIS — H353132 Nonexudative age-related macular degeneration, bilateral, intermediate dry stage: Secondary | ICD-10-CM | POA: Diagnosis not present

## 2020-05-02 ENCOUNTER — Ambulatory Visit: Payer: Medicare Other | Admitting: Dermatology

## 2020-05-14 ENCOUNTER — Ambulatory Visit: Payer: Medicare Other | Admitting: Dermatology

## 2020-05-14 ENCOUNTER — Other Ambulatory Visit: Payer: Self-pay

## 2020-05-14 DIAGNOSIS — D229 Melanocytic nevi, unspecified: Secondary | ICD-10-CM

## 2020-05-14 DIAGNOSIS — Z1283 Encounter for screening for malignant neoplasm of skin: Secondary | ICD-10-CM

## 2020-05-14 DIAGNOSIS — D1800 Hemangioma unspecified site: Secondary | ICD-10-CM

## 2020-05-14 DIAGNOSIS — L578 Other skin changes due to chronic exposure to nonionizing radiation: Secondary | ICD-10-CM | POA: Diagnosis not present

## 2020-05-14 DIAGNOSIS — L308 Other specified dermatitis: Secondary | ICD-10-CM

## 2020-05-14 DIAGNOSIS — L821 Other seborrheic keratosis: Secondary | ICD-10-CM

## 2020-05-14 DIAGNOSIS — Z85820 Personal history of malignant melanoma of skin: Secondary | ICD-10-CM | POA: Diagnosis not present

## 2020-05-14 DIAGNOSIS — L814 Other melanin hyperpigmentation: Secondary | ICD-10-CM

## 2020-05-14 NOTE — Progress Notes (Unsigned)
   Follow-Up Visit   Subjective  Samuel Jones is a 87 y.o. male who presents for the following: Eczema (Biopsy proven of right neck - Mometasone cream qhs) and Annual Exam (History of Melanoma - TBSE today). The patient presents for Total-Body Skin Exam (TBSE) for skin cancer screening and mole check.  The following portions of the chart were reviewed this encounter and updated as appropriate:   Tobacco  Allergies  Meds  Problems  Med Hx  Surg Hx  Fam Hx     Review of Systems:  No other skin or systemic complaints except as noted in HPI or Assessment and Plan.  Objective  Well appearing patient in no apparent distress; mood and affect are within normal limits.  A full examination was performed including scalp, head, eyes, ears, nose, lips, neck, chest, axillae, abdomen, back, buttocks, bilateral upper extremities, bilateral lower extremities, hands, feet, fingers, toes, fingernails, and toenails. All findings within normal limits unless otherwise noted below.  Objective  Mid Back: Well healed excision site with large graft  Objective  Neck - Anterior: Clear today   Assessment & Plan    Lentigines - Scattered tan macules - Due to sun exposure - Benign-appering, observe - Recommend daily broad spectrum sunscreen SPF 30+ to sun-exposed areas, reapply every 2 hours as needed. - Call for any changes  Seborrheic Keratoses - Stuck-on, waxy, tan-brown papules and plaques  - Discussed benign etiology and prognosis. - Observe - Call for any changes  Melanocytic Nevi - Tan-brown and/or pink-flesh-colored symmetric macules and papules - Benign appearing on exam today - Observation - Call clinic for new or changing moles - Recommend daily use of broad spectrum spf 30+ sunscreen to sun-exposed areas.   Hemangiomas - Red papules - Discussed benign nature - Observe - Call for any changes  Actinic Damage - Chronic, secondary to cumulative UV/sun exposure - diffuse  scaly erythematous macules with underlying dyspigmentation - Recommend daily broad spectrum sunscreen SPF 30+ to sun-exposed areas, reapply every 2 hours as needed.  - Call for new or changing lesions.  Skin cancer screening performed today.  History of melanoma Mid Back Clear. Observe for recurrence. Call clinic for new or changing lesions.  Recommend regular skin exams, daily broad-spectrum spf 30+ sunscreen use, and photoprotection.    Other eczema Neck - Anterior Biopsy proven Eczema vs Contact Derm - Resolved  Atopic dermatitis (eczema) is a chronic, relapsing, pruritic condition that can significantly affect quality of life. It is often associated with allergic rhinitis and/or asthma and can require treatment with topical medications, phototherapy, or in severe cases a biologic medication called Dupixent in older children and adults.   Skin cancer screening  Return in about 1 year (around 05/14/2021) for TBSE.   I, Ashok Cordia, CMA, am acting as scribe for Sarina Ser, MD .  Documentation: I have reviewed the above documentation for accuracy and completeness, and I agree with the above.  Sarina Ser, MD

## 2020-05-15 ENCOUNTER — Encounter: Payer: Self-pay | Admitting: Dermatology

## 2020-05-29 ENCOUNTER — Telehealth: Payer: Self-pay | Admitting: Family Medicine

## 2020-05-29 NOTE — Telephone Encounter (Signed)
Pts wife called stating that the pt is having frequent urination and burning. She declined an appt due to not having transportation and asked to have something sent in for pt. Please advise.      [id number] - [place] [address], [place] [address]  [address] [place] [address]  Phone: [phone] Fax: [phone]  Hours: Not open 24 hours

## 2020-05-29 NOTE — Telephone Encounter (Signed)
Patient was advised and states that he would have his wife to return call to office to schedule an visit. If patient's wife calls back okay for [facility] to schedule appointment.

## 2020-05-29 NOTE — Telephone Encounter (Signed)
Do not treat possible infections without seeing and evaluating the patient. Consider virtual visit if transportation is a barrier. Thanks!

## 2020-05-30 DIAGNOSIS — R300 Dysuria: Secondary | ICD-10-CM | POA: Diagnosis not present

## 2020-05-30 DIAGNOSIS — N390 Urinary tract infection, site not specified: Secondary | ICD-10-CM | POA: Diagnosis not present

## 2020-06-05 DIAGNOSIS — H6123 Impacted cerumen, bilateral: Secondary | ICD-10-CM | POA: Diagnosis not present

## 2020-06-05 DIAGNOSIS — H903 Sensorineural hearing loss, bilateral: Secondary | ICD-10-CM | POA: Diagnosis not present

## 2020-06-19 ENCOUNTER — Other Ambulatory Visit: Payer: Self-pay | Admitting: Family Medicine

## 2020-06-19 MED ORDER — ALLOPURINOL 300 MG PO TABS: 300.0000 mg | ORAL_TABLET | Freq: Every day | ORAL | 0 refills | Status: DC

## 2020-06-19 NOTE — Telephone Encounter (Signed)
Patient has appointment 4/19- Rx approved and lab message added.

## 2020-06-19 NOTE — Telephone Encounter (Signed)
Medication Refill - Medication: Allopurinol 300 mg  Has the patient contacted their pharmacy? No. (Agent: If no, request that the patient contact the pharmacy for the refill.) (Agent: If yes, when and what did the pharmacy advise?)  Preferred Pharmacy (with phone number or street name): [facility] [address]     Agent: Please be advised that RX refills may take up to 3 business days. We ask that you follow-up with your pharmacy.

## 2020-06-22 ENCOUNTER — Encounter: Payer: Self-pay | Admitting: Emergency Medicine

## 2020-06-22 ENCOUNTER — Emergency Department
Admission: EM | Admit: 2020-06-22 | Discharge: 2020-06-22 | Disposition: A | Discharge status: Home / Self Care | Payer: Medicare Other | Attending: Emergency Medicine | Admitting: Emergency Medicine

## 2020-06-22 ENCOUNTER — Other Ambulatory Visit: Payer: Self-pay

## 2020-06-22 DIAGNOSIS — E039 Hypothyroidism, unspecified: Secondary | ICD-10-CM | POA: Insufficient documentation

## 2020-06-22 DIAGNOSIS — N390 Urinary tract infection, site not specified: Secondary | ICD-10-CM | POA: Insufficient documentation

## 2020-06-22 DIAGNOSIS — R300 Dysuria: Secondary | ICD-10-CM | POA: Diagnosis present

## 2020-06-22 DIAGNOSIS — Z85820 Personal history of malignant melanoma of skin: Secondary | ICD-10-CM | POA: Insufficient documentation

## 2020-06-22 DIAGNOSIS — Z7982 Long term (current) use of aspirin: Secondary | ICD-10-CM | POA: Diagnosis not present

## 2020-06-22 DIAGNOSIS — Z8673 Personal history of transient ischemic attack (TIA), and cerebral infarction without residual deficits: Secondary | ICD-10-CM | POA: Insufficient documentation

## 2020-06-22 DIAGNOSIS — Z85038 Personal history of other malignant neoplasm of large intestine: Secondary | ICD-10-CM | POA: Diagnosis not present

## 2020-06-22 DIAGNOSIS — I10 Essential (primary) hypertension: Secondary | ICD-10-CM | POA: Diagnosis not present

## 2020-06-22 DIAGNOSIS — Z79899 Other long term (current) drug therapy: Secondary | ICD-10-CM | POA: Diagnosis not present

## 2020-06-22 DIAGNOSIS — Z87891 Personal history of nicotine dependence: Secondary | ICD-10-CM | POA: Diagnosis not present

## 2020-06-22 LAB — URINALYSIS, COMPLETE (UACMP) WITH MICROSCOPIC
Bacteria, UA: NONE SEEN
Bilirubin Urine: NEGATIVE
Glucose, UA: NEGATIVE mg/dL
Hgb urine dipstick: NEGATIVE
Ketones, ur: NEGATIVE mg/dL
Nitrite: NEGATIVE
Protein, ur: NEGATIVE mg/dL
Specific Gravity, Urine: 1.023 (ref 1.005–1.030)
WBC, UA: >50 WBC/hpf — ABNORMAL HIGH (ref 0–5)
pH: 6 (ref 5.0–8.0)

## 2020-06-22 LAB — BASIC METABOLIC PANEL
Anion gap: 7 (ref 5–15)
BUN: 20 mg/dL (ref 8–23)
CO2: 27 mmol/L (ref 22–32)
Calcium: 9.5 mg/dL (ref 8.9–10.3)
Chloride: 105 mmol/L (ref 98–111)
Creatinine, Ser: 0.6 mg/dL — ABNORMAL LOW (ref 0.61–1.24)
GFR, Estimated: >60 mL/min (ref >60)
Glucose, Bld: 98 mg/dL (ref 70–99)
Potassium: 4.4 mmol/L (ref 3.5–5.1)
Sodium: 139 mmol/L (ref 135–145)

## 2020-06-22 LAB — CBC
HCT: 43.1 % (ref 39.0–52.0)
Hemoglobin: 14.3 g/dL (ref 13.0–17.0)
MCH: 31.2 pg (ref 26.0–34.0)
MCHC: 33.2 g/dL (ref 30.0–36.0)
MCV: 94.1 fL (ref 80.0–100.0)
Platelets: 230 10*3/uL (ref 150–400)
RBC: 4.58 MIL/uL (ref 4.22–5.81)
RDW: 14.5 % (ref 11.5–15.5)
WBC: 7 10*3/uL (ref 4.0–10.5)
nRBC: 0 % (ref 0.0–0.2)

## 2020-06-22 MED ORDER — CEPHALEXIN 500 MG PO CAPS: 500.0000 mg | ORAL_CAPSULE | Freq: Four times a day (QID) | ORAL | 0 refills | Status: AC

## 2020-06-22 MED ORDER — SODIUM CHLORIDE 0.9 % IV SOLN
1.0000 g | Freq: Once | INTRAVENOUS | Status: AC
  Administered 2020-06-22: 1 g via INTRAVENOUS
  Filled 2020-06-22: qty 10

## 2020-06-22 NOTE — ED Notes (Signed)
Pt has been provided with discharge instructions. Pt denies any questions or concerns at this time. Pt verbalizes understanding for follow up care and d/c.  VSS.  Pt left department with all belongings.  

## 2020-06-22 NOTE — ED Provider Notes (Signed)
[facility] Emergency Department Provider Note   ____________________________________________   I have reviewed the triage vital signs and the nursing notes.   HISTORY  Chief Complaint Urinary Frequency and Dysuria   History limited by: Not Limited   HPI Samuel Jones is a 87 y.o. male who presents to the emergency department today because of concern for possible UTI. The patient has noticed burning with urination as well as increased urinary frequency over the past three days. The patient was seen at walk in clinic a couple of weeks ago for similar symptoms and was diagnosed with a UTI finished a 7 day course of antibiotics and symptoms came back shortly after he finished it. He denies any fevers. Denies any nausea or vomiting.     Records reviewed. Per medical record review patient has a history of HLD, HTN.   Past Medical History:  Diagnosis Date  . Anxiety   . Cataract   . Colon cancer ([place]) 2012  . Gout   . Hyperlipidemia   . Hypertension   . Melanoma ([place])   . Oxygen deficiency   . Post-polio syndrome   . Stroke ([place])   . Thyroid disease     Patient Active Problem List   Diagnosis Date Noted  . Esophageal dysphagia 12/27/2019  . Pressure injury of sacral region, stage 1 12/27/2019  . Skin lesion of right lower extremity 12/27/2019  . History of melanoma 12/27/2019  . Lymphedema 12/13/2018  . Advanced care planning/counseling discussion 11/26/2017  . Gout 09/22/2017  . Post-polio syndrome 09/18/2017  . Loss of hearing 09/18/2017  . History of CVA (cerebrovascular accident) 09/18/2017  . History of colon cancer 09/18/2017  . Hypothyroidism 09/18/2017  . Essential hypertension 09/18/2017  . Cognitive impairment 09/18/2017    Past Surgical History:  Procedure Laterality Date  . APPENDECTOMY    . CARPAL TUNNEL RELEASE     R wrist  . CATARACT EXTRACTION Bilateral   . CHOLECYSTECTOMY    . COLON SURGERY    . FRACTURE SURGERY      R knee and L ankle  . HERNIA REPAIR    . MELANOMA EXCISION      Prior to Admission medications   Medication Sig Start Date End Date Taking? Authorizing Provider  acetaminophen (TYLENOL) 325 MG tablet Take 650 mg by mouth every 6 (six) hours as needed.    [provider]  allopurinol (ZYLOPRIM) 300 MG tablet Take 1 tablet (300 mg total) by mouth daily. 06/19/20   Virginia Crews, MD  aspirin EC 81 MG tablet Take 81 mg by mouth daily.    [provider]  atenolol (TENORMIN) 25 MG tablet Take 1 tablet (25 mg total) by mouth daily. 01/13/20   Bacigalupo, Dionne Bucy, MD  colchicine 0.6 MG tablet Take 1 tablet (0.6 mg total) by mouth daily. 04/09/20   Virginia Crews, MD  donepezil (ARICEPT) 10 MG tablet TAKE 1 TABLET BY MOUTH EVERY DAY IN THE EVENING 01/21/20   Bacigalupo, Dionne Bucy, MD  FLUoxetine (PROZAC) 20 MG tablet Take 20 mg by mouth daily.    [provider]  fluticasone (FLONASE) 50 MCG/ACT nasal spray Place into both nostrils daily.    [provider]  ketoconazole (NIZORAL) 2 % shampoo SHAMPOO WITH AS DIRECTED AS DIRECTED SHAMPOO SCALP 2 TIMES A WEEK ON MONDAY AND FRIDAY MORNINGS, LET SIT 15 MINUTES AND RINSE OUT 01/23/20   Ralene Bathe, MD  levothyroxine (SYNTHROID) 100 MCG tablet TAKE 1 TABLET  BY MOUTH EVERY DAY BEFORE BREAKFAST 11/12/19   Virginia Crews, MD  metroNIDAZOLE (METROGEL) 0.75 % gel APPLY A SMALL AMOUNT TO AFFECTED AREA EVERY EVENING 02/27/20   Ralene Bathe, MD  mometasone (ELOCON) 0.1 % cream APPLY TO AFFECTED AREAS/ RASH ONCE TO TWICE A DAY AS NEEDED 04/10/20   Ralene Bathe, MD  Multiple Vitamin (MULTIVITAMIN) capsule Take 1 capsule by mouth daily.    [provider]  Multiple Vitamins-Minerals (OCUVITE PO) Take 120 mg by mouth daily.    [provider]  Multiple Vitamins-Minerals (PRESERVISION AREDS PO) Take by mouth.    [provider]  NIFEdipine (ADALAT CC) 30 MG 24 hr tablet TAKE 2  TABLETS BY MOUTH DAILY 02/27/20   Bacigalupo, Dionne Bucy, MD  nystatin-triamcinolone (MYCOLOG II) cream Apply 1 application topically 2 (two) times daily.    [provider]  Omega-3 Fatty Acids (FISH OIL) 1000 MG CAPS Take 3 capsules by mouth daily.    [provider]  OXYGEN Inhale into the lungs. 4 Liters O2 nightly    [provider]  triamcinolone ointment (KENALOG) 0.5 % Apply 1 application topically 2 (two) times daily. For rash on L ankle 05/27/18   Virginia Crews, MD    Allergies Patient has no known allergies.  Family History  Problem Relation Age of Onset  . Diabetes Mother     Social History Social History   Tobacco Use  . Smoking status: Former Smoker    Types: Cigarettes    Quit date: 03/09/1974    Years since quitting: 46.3  . Smokeless tobacco: Never Used  Vaping Use  . Vaping Use: Never used  Substance Use Topics  . Alcohol use: Never  . Drug use: Never    Review of Systems Constitutional: No fever/chills Eyes: No visual changes. ENT: No sore throat. Cardiovascular: Denies chest pain. Respiratory: Denies shortness of breath. Gastrointestinal: No abdominal pain.  No nausea, no vomiting.  No diarrhea.   Genitourinary: Positive for dysuria and increased urinary frequency.  Musculoskeletal: Negative for back pain. Skin: Negative for rash. Neurological: Negative for headaches, focal weakness or numbness.  ____________________________________________   PHYSICAL EXAM:  VITAL SIGNS: ED Triage Vitals  Enc Vitals Group     BP 06/22/20 1555 122/75     Pulse Rate 06/22/20 1555 67     Resp 06/22/20 1555 18     Temp 06/22/20 1555 98.4 F (36.9 C)     Temp Source 06/22/20 1555 Oral     SpO2 06/22/20 1555 99 %     Weight 06/22/20 1553 180 lb (81.6 kg)     Height 06/22/20 1553 6' (1.829 m)     Head Circumference --      Peak Flow --      Pain Score 06/22/20 1552 5   Constitutional: Alert and oriented.  Eyes: Conjunctivae  are normal.  ENT      Head: Normocephalic and atraumatic.      Nose: No congestion/rhinnorhea.      Mouth/Throat: Mucous membranes are moist.      Neck: No stridor. Hematological/Lymphatic/Immunilogical: No cervical lymphadenopathy. Cardiovascular: Normal rate, regular rhythm.  No murmurs, rubs, or gallops.  Respiratory: Normal respiratory effort without tachypnea nor retractions. Breath sounds are clear and equal bilaterally. No wheezes/rales/rhonchi. Gastrointestinal: Soft and non tender. No rebound. No guarding.  Genitourinary: Deferred Musculoskeletal: Normal range of motion in all extremities. No lower extremity edema. Neurologic:  Normal speech and language. No gross focal neurologic deficits  are appreciated.  Skin:  Skin is warm, dry and intact. No rash noted. Psychiatric: Mood and affect are normal. Speech and behavior are normal. Patient exhibits appropriate insight and judgment.  ____________________________________________    LABS (pertinent positives/negatives)  CBC wbc 7.0, hgb 14.3, plt 230 BMP wnl except cr 0.60 UA cloudy, large leukocytes, >50 WBC   ____________________________________________   EKG  None  ____________________________________________    RADIOLOGY  None  ____________________________________________   PROCEDURES  Procedures  ____________________________________________   INITIAL IMPRESSION / ASSESSMENT AND PLAN / ED COURSE  Pertinent labs & imaging results that were available during my care of the patient were reviewed by me and considered in my medical decision making (see chart for details).   Patient presented to the emergency department today because of concern for urinary tract infection. UA here does show large leukocytes and >50 WBC. Will send urine culture. Will start patient on antibiotics. Blood work without concerning leukocytosis or kidney dysfunction.   ____________________________________________   FINAL CLINICAL  IMPRESSION(S) / ED DIAGNOSES  Final diagnoses:  Lower urinary tract infectious disease     Note: This dictation was prepared with Dragon dictation. Any transcriptional errors that result from this process are unintentional     Nance Pear, MD 06/22/20 1810

## 2020-06-22 NOTE — Discharge Instructions (Addendum)
Please seek medical attention for any high fevers, chest pain, shortness of breath, change in behavior, persistent vomiting, bloody stool or any other new or concerning symptoms.  

## 2020-06-22 NOTE — ED Triage Notes (Signed)
Pt comes into the ED via POV with his son c/o possible UTI with symptoms including dysuria and urinary frequency. Pt was dx with UTI 2 weeks ago and completed antibiotics, but once they were completed, he started having symptoms again.  Pt in NAD at this time with even and unlabored respirations.

## 2020-06-25 LAB — URINE CULTURE: Culture: >=100000 — AB

## 2020-06-26 ENCOUNTER — Encounter: Payer: Medicare Other | Admitting: Family Medicine

## 2020-06-28 ENCOUNTER — Ambulatory Visit: Payer: Medicare Other

## 2020-07-23 ENCOUNTER — Ambulatory Visit (INDEPENDENT_AMBULATORY_CARE_PROVIDER_SITE_OTHER): Payer: Medicare Other | Admitting: Family Medicine

## 2020-07-23 ENCOUNTER — Other Ambulatory Visit: Payer: Self-pay

## 2020-07-23 ENCOUNTER — Encounter: Payer: Self-pay | Admitting: Family Medicine

## 2020-07-23 VITALS — BP 92/68 | HR 94 | Temp 98.2°F

## 2020-07-23 DIAGNOSIS — R300 Dysuria: Secondary | ICD-10-CM | POA: Diagnosis not present

## 2020-07-23 DIAGNOSIS — L309 Dermatitis, unspecified: Secondary | ICD-10-CM | POA: Diagnosis not present

## 2020-07-23 LAB — POCT URINALYSIS DIPSTICK
Bilirubin, UA: NEGATIVE
Blood, UA: NEGATIVE
Glucose, UA: NEGATIVE
Ketones, UA: NEGATIVE
Nitrite, UA: NEGATIVE
Protein, UA: POSITIVE — AB
Spec Grav, UA: 1.02 (ref 1.010–1.025)
Urobilinogen, UA: NEGATIVE E.U./dL — AB
pH, UA: 6.5 (ref 5.0–8.0)

## 2020-07-23 MED ORDER — SULFAMETHOXAZOLE-TRIMETHOPRIM 800-160 MG PO TABS: 1.0000 | ORAL_TABLET | Freq: Two times a day (BID) | ORAL | 0 refills | Status: DC

## 2020-07-23 NOTE — Progress Notes (Signed)
Acute Office Visit  Subjective:    Patient ID: Samuel Jones, male    DOB: 03/20/1933, 87 y.o.   MRN: [id number]  No chief complaint on file.   HPI Patient is in today for his annual wellness exam.  However due to some acute issues has chosen to postpone the physical. Patient states that he has had 3 UTI's recently.  He was seen at the Urgent Care and ER .  He was treated with antibiotics.  He complains today of possibly having another.   Since having the UTI he states he has had a rash on his face and neck. He also complains of right should pain for about 2 months with no known trauma.  Past Medical History:  Diagnosis Date  . Anxiety   . Cataract   . Colon cancer ([place]) 2012  . Gout   . Hyperlipidemia   . Hypertension   . Melanoma ([place])   . Oxygen deficiency   . Post-polio syndrome   . Stroke ([place])   . Thyroid disease     Past Surgical History:  Procedure Laterality Date  . APPENDECTOMY    . CARPAL TUNNEL RELEASE     R wrist  . CATARACT EXTRACTION Bilateral   . CHOLECYSTECTOMY    . COLON SURGERY    . FRACTURE SURGERY     R knee and L ankle  . HERNIA REPAIR    . MELANOMA EXCISION      Family History  Problem Relation Age of Onset  . Diabetes Mother     Social History   Socioeconomic History  . Marital status: Married    Spouse name: Not on file  . Number of children: 4  . Years of education: Not on file  . Highest education level: High school graduate  Occupational History  . Occupation: retired     Comment: Catering manager  Tobacco Use  . Smoking status: Former Smoker    Types: Cigarettes    Quit date: 03/09/1974    Years since quitting: 46.4  . Smokeless tobacco: Never Used  Vaping Use  . Vaping Use: Never used  Substance and Sexual Activity  . Alcohol use: Never  . Drug use: Never  . Sexual activity: Not on file  Other Topics Concern  . Not on file  Social History Narrative  . Not on file   Social Determinants of Health    Financial Resource Strain: Not on file  Food Insecurity: Not on file  Transportation Needs: Not on file  Physical Activity: Not on file  Stress: Not on file  Social Connections: Not on file  Intimate Partner Violence: Not on file    Outpatient Medications Prior to Visit  Medication Sig Dispense Refill  . acetaminophen (TYLENOL) 325 MG tablet Take 650 mg by mouth every 6 (six) hours as needed.    Marland Kitchen allopurinol (ZYLOPRIM) 300 MG tablet Take 1 tablet (300 mg total) by mouth daily. 90 tablet 0  . aspirin EC 81 MG tablet Take 81 mg by mouth daily.    Marland Kitchen atenolol (TENORMIN) 25 MG tablet Take 1 tablet (25 mg total) by mouth daily. 90 tablet 1  . colchicine 0.6 MG tablet Take 1 tablet (0.6 mg total) by mouth daily. 90 tablet 1  . donepezil (ARICEPT) 10 MG tablet TAKE 1 TABLET BY MOUTH EVERY DAY IN THE EVENING 90 tablet 0  . FLUoxetine (PROZAC) 20 MG tablet Take 20 mg by mouth daily.    . fluticasone (FLONASE) 50  MCG/ACT nasal spray Place into both nostrils daily.    Marland Kitchen ketoconazole (NIZORAL) 2 % shampoo SHAMPOO WITH AS DIRECTED AS DIRECTED SHAMPOO SCALP 2 TIMES A WEEK ON MONDAY AND FRIDAY MORNINGS, LET SIT 15 MINUTES AND RINSE OUT 120 mL 5  . levothyroxine (SYNTHROID) 100 MCG tablet TAKE 1 TABLET BY MOUTH EVERY DAY BEFORE BREAKFAST 90 tablet 3  . metroNIDAZOLE (METROGEL) 0.75 % gel APPLY A SMALL AMOUNT TO AFFECTED AREA EVERY EVENING 45 g 0  . mometasone (ELOCON) 0.1 % cream APPLY TO AFFECTED AREAS/ RASH ONCE TO TWICE A DAY AS NEEDED 45 g 0  . Multiple Vitamin (MULTIVITAMIN) capsule Take 1 capsule by mouth daily.    . Multiple Vitamins-Minerals (OCUVITE PO) Take 120 mg by mouth daily.    . Multiple Vitamins-Minerals (PRESERVISION AREDS PO) Take by mouth.    Marland Kitchen NIFEdipine (ADALAT CC) 30 MG 24 hr tablet TAKE 2 TABLETS BY MOUTH DAILY 180 tablet 1  . nystatin-triamcinolone (MYCOLOG II) cream Apply 1 application topically 2 (two) times daily.    . Omega-3 Fatty Acids (FISH OIL) 1000 MG CAPS Take 3  capsules by mouth daily.    . OXYGEN Inhale into the lungs. 4 Liters O2 nightly    . triamcinolone ointment (KENALOG) 0.5 % Apply 1 application topically 2 (two) times daily. For rash on L ankle 60 g 1   No facility-administered medications prior to visit.    No Known Allergies  Review of Systems  Constitutional: Negative.   HENT: Positive for hearing loss.   Eyes: Negative.   Respiratory: Negative.   Cardiovascular: Negative.   Gastrointestinal: Negative.   Endocrine: Negative.   Genitourinary: Positive for difficulty urinating, dysuria and enuresis.  Musculoskeletal: Negative.   Skin: Negative.   Allergic/Immunologic: Negative.   Neurological: Negative.   Hematological: Negative.   Psychiatric/Behavioral: Negative.        Objective:    Physical Exam Constitutional:      General: He is not in acute distress.    Appearance: He is well-developed.  HENT:     Head: Normocephalic and atraumatic.     Right Ear: Hearing normal.     Left Ear: Hearing normal.     Nose: Nose normal.  Eyes:     General: Lids are normal. No scleral icterus.       Right eye: No discharge.        Left eye: No discharge.     Conjunctiva/sclera: Conjunctivae normal.  Cardiovascular:     Rate and Rhythm: Normal rate and regular rhythm.     Pulses: Normal pulses.     Heart sounds: Normal heart sounds.  Pulmonary:     Effort: Pulmonary effort is normal. No respiratory distress.     Breath sounds: Normal breath sounds.  Abdominal:     General: Bowel sounds are normal.     Palpations: Abdomen is soft.  Musculoskeletal:        General: Normal range of motion.  Skin:    Findings: No lesion or rash.  Neurological:     Mental Status: He is alert and oriented to person, place, and time.  Psychiatric:        Speech: Speech normal.        Behavior: Behavior normal.        Thought Content: Thought content normal.     BP 92/68   Pulse 94   Temp 98.2 F (36.8 C)   SpO2 99%  Wt Readings from  Last 3 Encounters:  06/22/20 180 lb (81.6 kg)  12/26/19 182 lb (82.6 kg)  06/27/19 230 lb (104.3 kg)    Health Maintenance Due  Topic Date Due  . COVID-19 Vaccine (4 - Booster for Pfizer series) 04/13/2020    There are no preventive care reminders to display for this patient.   Lab Results  Component Value Date   TSH 1.040 12/26/2019   Lab Results  Component Value Date   WBC 7.0 06/22/2020   HGB 14.3 06/22/2020   HCT 43.1 06/22/2020   MCV 94.1 06/22/2020   PLT 230 06/22/2020   Lab Results  Component Value Date   NA 139 06/22/2020   K 4.4 06/22/2020   CO2 27 06/22/2020   GLUCOSE 98 06/22/2020   BUN 20 06/22/2020   CREATININE 0.60 (L) 06/22/2020   BILITOT 0.5 06/27/2019   ALKPHOS 91 06/27/2019   AST 32 06/27/2019   ALT 26 06/27/2019   PROT 7.3 06/27/2019   ALBUMIN 4.6 06/27/2019   CALCIUM 9.5 06/22/2020   ANIONGAP 7 06/22/2020   Lab Results  Component Value Date   CHOL 186 06/27/2019   Lab Results  Component Value Date   HDL 63 06/27/2019   Lab Results  Component Value Date   LDLCALC 101 (H) 06/27/2019   Lab Results  Component Value Date   TRIG 125 06/27/2019   Lab Results  Component Value Date   CHOLHDL 3.0 06/27/2019   Lab Results  Component Value Date   HGBA1C 5.5 06/27/2019       Assessment & Plan:   1. Dysuria Treated for UTI's 3 times since April 2022. Went to the ER on 06-22-20 and culture isolated Aerococcus. Recurrence of dysuria but no fever. Will treat with Septra and repeat C&S. Increase fluid intake. - POCT urinalysis dipstick - Urine Culture - sulfamethoxazole-trimethoprim (BACTRIM DS) 800-160 MG tablet; Take 1 tablet by mouth 2 (two) times daily.  Dispense: 20 tablet; Refill: 0  2. Dermatitis Recent rash on the left cheek. Recommend using the Mometasone 1-2 times a day. Suspect contact dermatitis.        Juluis Mire, CMA   I, Vernie Murders, PA-C, have reviewed all documentation for this visit. The documentation  on 07/23/20 for the exam, diagnosis, procedures, and orders are all accurate and complete.

## 2020-07-26 LAB — URINE CULTURE

## 2020-07-26 LAB — SPECIMEN STATUS REPORT

## 2020-08-08 ENCOUNTER — Other Ambulatory Visit: Payer: Self-pay

## 2020-08-08 NOTE — Telephone Encounter (Signed)
[facility] faxed refill request for the following medications:  donepezil (ARICEPT) 10 MG tablet   Please advise.

## 2020-08-09 MED ORDER — DONEPEZIL HCL 10 MG PO TABS: ORAL_TABLET | ORAL | 1 refills | Status: DC

## 2020-08-12 ENCOUNTER — Other Ambulatory Visit: Payer: Self-pay | Admitting: Family Medicine

## 2020-08-12 NOTE — Telephone Encounter (Signed)
Requested medication (s) are due for refill today: no  Requested medication (s) are on the active medication list: yes  Last refill:  04/09/20  Future visit scheduled: no  Notes to clinic:  overdue uric acid level   Requested Prescriptions  Pending Prescriptions Disp Refills   colchicine 0.6 MG tablet [Pharmacy Med Name: COLCHICINE 0.6MG  TABLETS] 90 tablet 1    Sig: TAKE 1 TABLET(0.6 MG) BY MOUTH DAILY      Endocrinology:  Gout Agents Failed - 08/12/2020  6:39 AM      Failed - Uric Acid in normal range and within 360 days    Uric Acid  Date Value Ref Range Status  11/25/2017 4.5 3.7 - 8.6 mg/dL Final    Comment:               Therapeutic target for gout patients: <6.0          Failed - Cr in normal range and within 360 days    Creatinine, Ser  Date Value Ref Range Status  06/22/2020 0.60 (L) 0.61 - 1.24 mg/dL Final          Passed - Valid encounter within last 12 months    Recent Outpatient Visits           2 weeks ago [place], Vickki Muff, PA-C   7 months ago Hypothyroidism, unspecified type   [employer] [facility], Dionne Bucy, MD   1 year ago Essential hypertension   [facility], Dionne Bucy, MD   1 year ago Encounter for [employer] annual wellness exam   [facility] [place], [place], LPN   1 year ago Cellulitis of left lower extremity   [place], [place], [place]

## 2020-08-13 ENCOUNTER — Other Ambulatory Visit: Payer: Self-pay | Admitting: Family Medicine

## 2020-08-13 MED ORDER — ATENOLOL 25 MG PO TABS: 25.0000 mg | ORAL_TABLET | Freq: Every day | ORAL | 1 refills | Status: DC

## 2020-08-13 NOTE — Telephone Encounter (Signed)
Dr. B patient. Ok to refill? LOV for chronic issues 12/26/19. Please advise. Thanks!

## 2020-08-13 NOTE — Telephone Encounter (Signed)
[facility] faxed refill request for the following medications:  atenolol (TENORMIN) 25 MG tablet  Last Rx:01/13/20 Qty: 90 Refills: 1 LOV: 07/23/20 Please advise. Thanks TNP

## 2020-09-15 DIAGNOSIS — R300 Dysuria: Secondary | ICD-10-CM | POA: Diagnosis not present

## 2020-09-15 DIAGNOSIS — N390 Urinary tract infection, site not specified: Secondary | ICD-10-CM | POA: Diagnosis not present

## 2020-11-02 ENCOUNTER — Other Ambulatory Visit: Payer: Self-pay | Admitting: Family Medicine

## 2020-11-02 ENCOUNTER — Telehealth: Payer: Self-pay | Admitting: Family Medicine

## 2020-11-02 DIAGNOSIS — N390 Urinary tract infection, site not specified: Secondary | ICD-10-CM

## 2020-11-02 DIAGNOSIS — R300 Dysuria: Secondary | ICD-10-CM

## 2020-11-02 MED ORDER — CIPROFLOXACIN HCL 500 MG PO TABS: 500.0000 mg | ORAL_TABLET | Freq: Two times a day (BID) | ORAL | 0 refills | Status: DC

## 2020-11-02 NOTE — Telephone Encounter (Signed)
Referral Request - Has patient seen PCP for this complaint? Yes.    Referral for which specialty: Urologist  Preferred provider/office: Any  Reason for referral: reoccurring UTI

## 2020-11-02 NOTE — Progress Notes (Signed)
Son reports patient having return of dysuria/UTI symptoms that has occurred 4-5 times since April 2022. Was treated at [facility] on 09-15-20 with Cipro for Staphylococcus. It helped, but, was advised to seek urology referral if it recurred. Will send refill of Cipro 500 mg BID #20 to his pharmacy and encouraged to drink extra fluids. Also, schedule urology referral.

## 2020-11-02 NOTE — Telephone Encounter (Signed)
See note in chart 11-02-20.

## 2020-11-08 ENCOUNTER — Ambulatory Visit: Payer: Medicare Other | Admitting: Urology

## 2020-11-08 ENCOUNTER — Other Ambulatory Visit: Payer: Self-pay

## 2020-11-08 VITALS — BP 135/64 | HR 69 | Ht 72.0 in | Wt 180.0 lb

## 2020-11-08 DIAGNOSIS — N390 Urinary tract infection, site not specified: Secondary | ICD-10-CM

## 2020-11-08 MED ORDER — CIPROFLOXACIN HCL 500 MG PO TABS: 500.0000 mg | ORAL_TABLET | Freq: Two times a day (BID) | ORAL | 0 refills | Status: AC

## 2020-11-08 NOTE — Progress Notes (Signed)
   11/08/20 3:54 PM   Samuel Jones [birth year] [id number]  CC: Recurrent UTIs  HPI: I saw Samuel Jones and his son today for recurrent UTIs.  He is a very comorbid and frail-appearing 87 year old male, and most of the history is obtained from his son today.  He reportedly has had 4-5 urinary tract infections over the last 6 months.  Primary complaint is dysuria, and this resolved with antibiotics.  He denies any gross hematuria.  He currently is on Cipro, which has resolved his dysuria.  There is no prior imaging to review.  Unable to void for urinalysis today, PVR normal at 132 mL.   PMH: Past Medical History:  Diagnosis Date   Anxiety    Cataract    Colon cancer ([place]) 2012   Gout    Hyperlipidemia    Hypertension    Melanoma ([place])    Oxygen deficiency    Post-polio syndrome    Stroke [facility])    Thyroid disease     Surgical History: Past Surgical History:  Procedure Laterality Date   APPENDECTOMY     CARPAL TUNNEL RELEASE     R wrist   CATARACT EXTRACTION Bilateral    CHOLECYSTECTOMY     COLON SURGERY     FRACTURE SURGERY     R knee and L ankle   HERNIA REPAIR     MELANOMA EXCISION       Family History: Family History  Problem Relation Age of Onset   Diabetes Mother     Social History:  reports that he quit smoking about 46 years ago. His smoking use included cigarettes. He has never used smokeless tobacco. He reports that he does not drink alcohol and does not use drugs.  Physical Exam: BP 135/64   Pulse 69   Ht 6' (1.829 m)   Wt 180 lb (81.6 kg)   BMI 24.41 kg/m    Constitutional:  Alert and oriented, No acute distress. Cardiovascular: No clubbing, cyanosis, or edema. Respiratory: Normal respiratory effort, no increased work of breathing. GI: Abdomen is soft, nontender, nondistended, no abdominal masses GU: Uncircumcised phallus with significant phimosis, unable to retract the foreskin to visualize the meatus  Laboratory Data: Urine culture data  reviewed  Pertinent Imaging: None to review  Assessment & Plan:   Very frail 87 year old male with recurrent UTIs over the last 6 months of unclear etiology.  We discussed possible etiologies including BPH, incomplete emptying, urolithiasis, phimosis, obstruction, idiopathic, or malignancy.  I recommended considering a CT urogram for further evaluation with his significant recurrent infections over the last 6 months.  I also recommended extending the Cipro to a 3-week course if this is a possible prostatitis that is recurring with incomplete treatment of short courses of antibiotics.  Also recommended cranberry tablet prophylaxis.  Return precautions were discussed extensively  -CT urogram to evaluate etiology of UTI, call with results.  His wife is in the hospital, and they would like to wait at least a few weeks before having this performed -Cipro course extended to 3 weeks -Cranberry tablet prophylaxis -Virtual visit symptom check 3 months -Consider dorsal slit in the future if CT/cystoscopy normal and recurrent infections  Nickolas Madrid, MD 11/08/2020  [facility] [address] [place], [place] [address] [phone]

## 2020-11-08 NOTE — Patient Instructions (Signed)
We extended the course of Cipro antibiotics to a total of 30 days in case this is infection in the prostate that keeps returning.  Take cranberry tablets twice daily, as these can help prevent infections  I ordered a CT scan for about 2 months from now, and this would look for any stones or bladder tumors or blockage that could be causing the recurrent infections.  If he has not had any further infections okay to cancel this.  Follow-up virtual visit in 3 months

## 2020-11-13 ENCOUNTER — Telehealth: Payer: Self-pay | Admitting: Family Medicine

## 2020-11-13 MED ORDER — LEVOTHYROXINE SODIUM 100 MCG PO TABS: ORAL_TABLET | ORAL | 3 refills | Status: AC

## 2020-11-13 MED ORDER — NIFEDIPINE ER 30 MG PO TB24: 60.0000 mg | ORAL_TABLET | Freq: Every day | ORAL | 1 refills | Status: AC

## 2020-11-13 NOTE — Telephone Encounter (Signed)
[facility] faxed refill request for the following medications:   NIFEdipine (ADALAT CC) 30 MG 24 hr tablet   levothyroxine (SYNTHROID) 100 MCG tablet   Please advise.

## 2020-12-14 ENCOUNTER — Other Ambulatory Visit: Payer: Self-pay

## 2020-12-14 ENCOUNTER — Ambulatory Visit (INDEPENDENT_AMBULATORY_CARE_PROVIDER_SITE_OTHER): Payer: Medicare Other | Admitting: Family Medicine

## 2020-12-14 ENCOUNTER — Encounter: Payer: Self-pay | Admitting: Family Medicine

## 2020-12-14 VITALS — BP 117/66 | HR 89 | Temp 98.1°F | Resp 16

## 2020-12-14 DIAGNOSIS — Z23 Encounter for immunization: Secondary | ICD-10-CM | POA: Diagnosis not present

## 2020-12-14 DIAGNOSIS — L89152 Pressure ulcer of sacral region, stage 2: Secondary | ICD-10-CM | POA: Diagnosis not present

## 2020-12-14 MED ORDER — LIDOCAINE 5 % EX OINT: 1.0000 "application " | TOPICAL_OINTMENT | CUTANEOUS | 0 refills | Status: AC | PRN

## 2020-12-14 MED ORDER — MUPIROCIN 2 % EX OINT: TOPICAL_OINTMENT | Freq: Two times a day (BID) | CUTANEOUS | 0 refills | Status: AC

## 2020-12-14 NOTE — Progress Notes (Signed)
BP 117/66 (BP Location: Left Arm, Patient Position: Sitting, Cuff Size: Normal)   Pulse 89   Temp 98.1 F (36.7 C) (Oral)   Resp 16   SpO2 98%    Subjective:    Patient ID: Samuel Jones, male    DOB: Dec 05, 1933, 87 y.o.   MRN: [id number]  HPI: Samuel Jones is a 87 y.o. male seen today for BFP due to staffing issues  Chief Complaint  Patient presents with   Rash   RASH Duration:  months  Location: buttock  Itching: no Burning: no Redness: no Oozing: no Scaling: no Blisters: no Painful: yes Fevers: no Change in detergents/soaps/personal care products: no Recent illness: no Recent travel:no History of same: no Context: stable Alleviating factors: nothing Treatments attempted:nothing Shortness of breath: no  Throat/tongue swelling: no Myalgias/arthralgias: no  Relevant past medical, surgical, family and social history reviewed and updated as indicated. Interim medical history since our last visit reviewed. Allergies and medications reviewed and updated.  Review of Systems  Constitutional: Negative.   Respiratory: Negative.    Cardiovascular: Negative.   Gastrointestinal: Negative.   Musculoskeletal: Negative.   Skin:  Positive for wound. Negative for color change, pallor and rash.  Psychiatric/Behavioral: Negative.     Per HPI unless specifically indicated above     Objective:    BP 117/66 (BP Location: Left Arm, Patient Position: Sitting, Cuff Size: Normal)   Pulse 89   Temp 98.1 F (36.7 C) (Oral)   Resp 16   SpO2 98%   Wt Readings from Last 3 Encounters:  11/08/20 180 lb (81.6 kg)  06/22/20 180 lb (81.6 kg)  12/26/19 182 lb (82.6 kg)    Physical Exam Vitals and nursing note reviewed.  Constitutional:      General: He is not in acute distress.    Appearance: Normal appearance. He is not ill-appearing, toxic-appearing or diaphoretic.  HENT:     Head: Normocephalic and atraumatic.     Right Ear: External ear normal.     Left Ear: External  ear normal.     Nose: Nose normal.     Mouth/Throat:     Mouth: Mucous membranes are moist.     Pharynx: Oropharynx is clear.  Eyes:     General: No scleral icterus.       Right eye: No discharge.        Left eye: No discharge.     Extraocular Movements: Extraocular movements intact.     Conjunctiva/sclera: Conjunctivae normal.     Pupils: Pupils are equal, round, and reactive to light.  Cardiovascular:     Rate and Rhythm: Normal rate and regular rhythm.     Pulses: Normal pulses.     Heart sounds: Normal heart sounds. No murmur heard.   No friction rub. No gallop.  Pulmonary:     Effort: Pulmonary effort is normal. No respiratory distress.     Breath sounds: Normal breath sounds. No stridor. No wheezing, rhonchi or rales.  Chest:     Chest wall: No tenderness.  Musculoskeletal:        General: Normal range of motion.     Cervical back: Normal range of motion and neck supple.  Skin:    General: Skin is warm and dry.     Capillary Refill: Capillary refill takes less than 2 seconds.     Coloration: Skin is not jaundiced or pale.     Findings: No bruising, erythema, lesion or rash.     Comments: [name],  erythematous area in natal cleft, tender to touch  Neurological:     General: No focal deficit present.     Mental Status: He is alert and oriented to person, place, and time. Mental status is at baseline.  Psychiatric:        Mood and Affect: Mood normal.        Behavior: Behavior normal.        Thought Content: Thought content normal.        Judgment: Judgment normal.    Results for orders placed or performed in visit on 07/23/20  Urine Culture   Specimen: Urine   Urine  Result Value Ref Range   Urine Culture, Routine Final report    Organism ID, Bacteria Comment   Specimen status report  Result Value Ref Range   specimen status report Comment   POCT urinalysis dipstick  Result Value Ref Range   Color, UA Amber    Clarity, UA cloudy    Glucose, UA Negative  Negative   Bilirubin, UA neg    Ketones, UA neg    Spec Grav, UA 1.020 1.010 - 1.025   Blood, UA neg    pH, UA 6.5 5.0 - 8.0   Protein, UA Positive (A) Negative   Urobilinogen, UA negative (A) 0.2 or 1.0 E.U./dL   Nitrite, UA neg    Leukocytes, UA Large (3+) (A) Negative   Appearance     Odor        Assessment & Plan:   Problem List Items Addressed This Visit   None Visit Diagnoses     Pressure injury of sacral region, stage 2 ([place])    -  Primary   Will treat with lidocaine and bactroban. Rx for donut cushion and referral for home health placed. Call with any concerns.    Relevant Orders   Ambulatory referral to [place]   Need for influenza vaccination       Relevant Orders   Flu Vaccine QUAD High Dose(Fluad) (Completed)        Follow up plan: Return Follow up with PCP in 2-3 weeks.

## 2020-12-25 ENCOUNTER — Telehealth: Payer: Self-pay

## 2020-12-25 NOTE — Telephone Encounter (Signed)
Noted  

## 2020-12-25 NOTE — Telephone Encounter (Signed)
Copied from [place] [id number]. Topic: Quick Communication - Home Health Verbal Orders >> Dec 24, 2020  9:25 AM Tessa Lerner A wrote: Caller/Agency: [facility] Number: [phone]   Requesting OT/PT/Skilled Nursing/Social Work/Speech Therapy: Skilled Nursing   Frequency: 1w5   Home health order placed by our office at 12/14/20.

## 2020-12-25 NOTE — Telephone Encounter (Signed)
Copied from [place] [id number]. Topic: General - Other >> Dec 25, 2020  9:47 AM Robina Ade, Helene Kelp D wrote: Reason for CRM: Lynnell Catalan with [facility] called and said that patients social work visit will be delay until next week due to his son request. If any question she can be reached at [phone], thanks.

## 2020-12-26 ENCOUNTER — Other Ambulatory Visit: Payer: Self-pay | Admitting: Family Medicine

## 2020-12-26 ENCOUNTER — Ambulatory Visit: Payer: Self-pay

## 2020-12-26 DIAGNOSIS — K5900 Constipation, unspecified: Secondary | ICD-10-CM | POA: Insufficient documentation

## 2020-12-26 MED ORDER — LACTULOSE 10 GM/15ML PO SOLN: 30.0000 g | Freq: Two times a day (BID) | ORAL | 0 refills | Status: AC | PRN

## 2020-12-26 NOTE — Telephone Encounter (Signed)
[place] for [employer]. Please let them ot change attending to me.  Thanks Dr Wynetta Emery. We will get it updated

## 2020-12-26 NOTE — Telephone Encounter (Signed)
Caller states he has not had a bowel movement in a few days and laxatives are not working seeking clinical advice or Rx  Son reports pt. Has not had a bowel movement in 3-4 days. Pt. Lives alone. Has tried Dulcolax and Milk of Magnesia. Having some abdominal cramping. Pt. Lives alone and it is difficult to get him in and out of the home. Would like to know what else pt. Can take from his PCP. Reviewed home remedies. Please advise pt.'s son.   Reason for Disposition  Over-The-Counter (OTC) medicines for constipation, questions about  Answer Assessment - Initial Assessment Questions 1. STOOL PATTERN OR FREQUENCY: "How often do you have a bowel movement (BM)?"  (Normal range: 3 times a day to every 3 days)  "When was your last BM?"       4 days ago 2. STRAINING: "Do you have to strain to have a BM?"      Yes 3. RECTAL PAIN: "Does your rectum hurt when the stool comes out?" If Yes, ask: "Do you have hemorrhoids? How bad is the pain?"  (Scale 1-10; or mild, moderate, severe)     No 4. STOOL COMPOSITION: "Are the stools hard?"      Hard 5. BLOOD ON STOOLS: "Has there been any blood on the toilet tissue or on the surface of the BM?" If Yes, ask: "When was the last time?"      No 6. CHRONIC CONSTIPATION: "Is this a new problem for you?"  If no, ask: "How long have you had this problem?" (days, weeks, months)      Yes 7. CHANGES IN DIET OR HYDRATION: "Have there been any recent changes in your diet?" "How much fluids are you drinking on a daily basis?"  "How much have you had to drink today?"     Not eating well 8. MEDICATIONS: "Have you been taking any new medications?" "Are you taking any narcotic pain medications?" (e.g., Vicodin, Percocet, morphine, Dilaudid)     No 9. LAXATIVES: "Have you been using any stool softeners, laxatives, or enemas?"  If yes, ask "What, how often, and when was the last time?"     MOM, Dulcolax 10. ACTIVITY:  "How much walking do you do every day?"  "Has your activity  level decreased in the past week?"        Decreased 11. CAUSE: "What do you think is causing the constipation?"        Unsure 12. OTHER SYMPTOMS: "Do you have any other symptoms?" (e.g., abdominal pain, bloating, fever, vomiting)       Abdominal 13. MEDICAL HISTORY: "Do you have a history of hemorrhoids, rectal fissures, or rectal surgery or rectal abscess?"         No 14. PREGNANCY: "Is there any chance you are pregnant?" "When was your last menstrual period?"       N/a  Protocols used: Constipation-A-AH

## 2020-12-27 NOTE — Telephone Encounter (Signed)
Left message for Merry Proud with details as below.

## 2020-12-28 ENCOUNTER — Emergency Department: Payer: Medicare Other

## 2020-12-28 ENCOUNTER — Ambulatory Visit: Payer: Self-pay | Admitting: *Deleted

## 2020-12-28 ENCOUNTER — Other Ambulatory Visit: Payer: Self-pay

## 2020-12-28 ENCOUNTER — Other Ambulatory Visit: Payer: Self-pay | Admitting: Family Medicine

## 2020-12-28 ENCOUNTER — Emergency Department
Admission: EM | Admit: 2020-12-28 | Discharge: 2020-12-28 | Disposition: A | Discharge status: Home / Self Care | Payer: Medicare Other | Attending: Emergency Medicine | Admitting: Emergency Medicine

## 2020-12-28 DIAGNOSIS — I10 Essential (primary) hypertension: Secondary | ICD-10-CM | POA: Insufficient documentation

## 2020-12-28 DIAGNOSIS — E039 Hypothyroidism, unspecified: Secondary | ICD-10-CM | POA: Insufficient documentation

## 2020-12-28 DIAGNOSIS — N3090 Cystitis, unspecified without hematuria: Secondary | ICD-10-CM

## 2020-12-28 DIAGNOSIS — R339 Retention of urine, unspecified: Secondary | ICD-10-CM | POA: Diagnosis not present

## 2020-12-28 DIAGNOSIS — K5900 Constipation, unspecified: Secondary | ICD-10-CM

## 2020-12-28 DIAGNOSIS — Z87891 Personal history of nicotine dependence: Secondary | ICD-10-CM | POA: Insufficient documentation

## 2020-12-28 DIAGNOSIS — K5641 Fecal impaction: Secondary | ICD-10-CM | POA: Diagnosis not present

## 2020-12-28 DIAGNOSIS — Z85038 Personal history of other malignant neoplasm of large intestine: Secondary | ICD-10-CM | POA: Insufficient documentation

## 2020-12-28 DIAGNOSIS — Z7982 Long term (current) use of aspirin: Secondary | ICD-10-CM | POA: Insufficient documentation

## 2020-12-28 DIAGNOSIS — Z79899 Other long term (current) drug therapy: Secondary | ICD-10-CM | POA: Insufficient documentation

## 2020-12-28 DIAGNOSIS — N200 Calculus of kidney: Secondary | ICD-10-CM | POA: Diagnosis not present

## 2020-12-28 LAB — CBC
HCT: 46.3 % (ref 39.0–52.0)
Hemoglobin: 15.6 g/dL (ref 13.0–17.0)
MCH: 31.3 pg (ref 26.0–34.0)
MCHC: 33.7 g/dL (ref 30.0–36.0)
MCV: 92.8 fL (ref 80.0–100.0)
Platelets: 277 10*3/uL (ref 150–400)
RBC: 4.99 MIL/uL (ref 4.22–5.81)
RDW: 14.8 % (ref 11.5–15.5)
WBC: 15.5 10*3/uL — ABNORMAL HIGH (ref 4.0–10.5)
nRBC: 0 % (ref 0.0–0.2)

## 2020-12-28 LAB — URINALYSIS, ROUTINE W REFLEX MICROSCOPIC
Bilirubin Urine: NEGATIVE
Glucose, UA: NEGATIVE mg/dL
Ketones, ur: 5 mg/dL — AB
Leukocytes,Ua: NEGATIVE
Nitrite: NEGATIVE
Protein, ur: 30 mg/dL — AB
Specific Gravity, Urine: 1.017 (ref 1.005–1.030)
WBC, UA: >50 WBC/hpf — ABNORMAL HIGH (ref 0–5)
pH: 6 (ref 5.0–8.0)

## 2020-12-28 LAB — LIPASE, BLOOD: Lipase: 61 U/L — ABNORMAL HIGH (ref 11–51)

## 2020-12-28 LAB — COMPREHENSIVE METABOLIC PANEL
ALT: 14 U/L (ref 0–44)
AST: 25 U/L (ref 15–41)
Albumin: 3.6 g/dL (ref 3.5–5.0)
Alkaline Phosphatase: 90 U/L (ref 38–126)
Anion gap: 14 (ref 5–15)
BUN: 31 mg/dL — ABNORMAL HIGH (ref 8–23)
CO2: 23 mmol/L (ref 22–32)
Calcium: 9.6 mg/dL (ref 8.9–10.3)
Chloride: 101 mmol/L (ref 98–111)
Creatinine, Ser: 1.13 mg/dL (ref 0.61–1.24)
GFR, Estimated: >60 mL/min (ref >60)
Glucose, Bld: 156 mg/dL — ABNORMAL HIGH (ref 70–99)
Potassium: 3.7 mmol/L (ref 3.5–5.1)
Sodium: 138 mmol/L (ref 135–145)
Total Bilirubin: 1.2 mg/dL (ref 0.3–1.2)
Total Protein: 7.2 g/dL (ref 6.5–8.1)

## 2020-12-28 MED ORDER — IOHEXOL 300 MG/ML  SOLN
100.0000 mL | Freq: Once | INTRAMUSCULAR | Status: AC | PRN
  Administered 2020-12-28: 100 mL via INTRAVENOUS

## 2020-12-28 MED ORDER — SODIUM CHLORIDE 0.9 % IV BOLUS
1000.0000 mL | Freq: Once | INTRAVENOUS | Status: AC
  Administered 2020-12-28: 1000 mL via INTRAVENOUS

## 2020-12-28 MED ORDER — CEFDINIR 300 MG PO CAPS: 300.0000 mg | ORAL_CAPSULE | Freq: Two times a day (BID) | ORAL | 0 refills | Status: DC

## 2020-12-28 MED ORDER — CEFDINIR 300 MG PO CAPS: 300.0000 mg | ORAL_CAPSULE | Freq: Two times a day (BID) | ORAL | 0 refills | Status: AC

## 2020-12-28 MED ORDER — SODIUM CHLORIDE 0.9 % IV SOLN
1.0000 g | Freq: Once | INTRAVENOUS | Status: AC
  Administered 2020-12-28: 1 g via INTRAVENOUS
  Filled 2020-12-28: qty 10

## 2020-12-28 NOTE — ED Provider Notes (Signed)
[facility] Emergency Department Provider Note  Time seen: 2:35 PM  I have reviewed the triage vital signs and the nursing notes.   HISTORY  Chief Complaint Constipation  HPI Samuel Jones is a 87 y.o. male with a past medical history of anxiety, hypertension, hyperlipidemia, prior CVA, presents to the emergency department with constipation and intermittent abdominal pains.  According to the son for the past week or so the patient has been constipated with no bowel movements.  They started Dulcolax approximately 5 days ago with no bowel movement, son called the PCP and 2 days ago started lactulose but again no bowel movement.  Patient has been intermittently complaining of abdominal pain and holding his lower abdomen or penis at times.  Denies any dysuria.  States he is able to urinate.  Appears to have some urinary incontinence at times.  No fever.  No nausea or vomiting.  Past Medical History:  Diagnosis Date   Anxiety    Cataract    Colon cancer ([place]) 2012   Gout    Hyperlipidemia    Hypertension    Melanoma ([place])    Oxygen deficiency    Post-polio syndrome    Stroke [facility])    Thyroid disease     Patient Active Problem List   Diagnosis Date Noted   Constipation 12/26/2020   Esophageal dysphagia 12/27/2019   Pressure injury of sacral region, stage 1 12/27/2019   Skin lesion of right lower extremity 12/27/2019   History of melanoma 12/27/2019   Lymphedema 12/13/2018   Advanced care planning/counseling discussion 11/26/2017   Gout 09/22/2017   Post-polio syndrome 09/18/2017   Loss of hearing 09/18/2017   History of CVA (cerebrovascular accident) 09/18/2017   History of colon cancer 09/18/2017   Hypothyroidism 09/18/2017   Essential hypertension 09/18/2017   Cognitive impairment 09/18/2017    Past Surgical History:  Procedure Laterality Date   APPENDECTOMY     CARPAL TUNNEL RELEASE     R wrist   CATARACT EXTRACTION Bilateral     CHOLECYSTECTOMY     COLON SURGERY     FRACTURE SURGERY     R knee and L ankle   HERNIA REPAIR     MELANOMA EXCISION      Prior to Admission medications   Medication Sig Start Date End Date Taking? Authorizing Provider  acetaminophen (TYLENOL) 325 MG tablet Take 650 mg by mouth every 6 (six) hours as needed.    [provider]  allopurinol (ZYLOPRIM) 300 MG tablet Take 1 tablet (300 mg total) by mouth daily. 06/19/20   Virginia Crews, MD  aspirin EC 81 MG tablet Take 81 mg by mouth daily.    [provider]  atenolol (TENORMIN) 25 MG tablet Take 1 tablet (25 mg total) by mouth daily. 08/13/20   Jerrol Banana., MD  colchicine 0.6 MG tablet TAKE 1 TABLET(0.6 MG) BY MOUTH DAILY 08/13/20   Bacigalupo, Dionne Bucy, MD  donepezil (ARICEPT) 10 MG tablet TAKE 1 TABLET BY MOUTH EVERY DAY IN THE EVENING 08/09/20   Bacigalupo, Dionne Bucy, MD  FLUoxetine (PROZAC) 20 MG tablet Take 20 mg by mouth daily.    [provider]  fluticasone (FLONASE) 50 MCG/ACT nasal spray Place into both nostrils daily.    [provider]  ketoconazole (NIZORAL) 2 % shampoo SHAMPOO WITH AS DIRECTED AS DIRECTED SHAMPOO SCALP 2 TIMES A WEEK ON MONDAY AND FRIDAY MORNINGS, LET SIT 15 MINUTES AND RINSE OUT 01/23/20   Nehemiah Massed,  Monia Sabal, MD  lactulose (CHRONULAC) 10 GM/15ML solution Take 45 mLs (30 g total) by mouth 2 (two) times daily as needed for severe constipation. 12/26/20   Gwyneth Sprout, FNP  levothyroxine (SYNTHROID) 100 MCG tablet TAKE 1 TABLET BY MOUTH EVERY DAY BEFORE BREAKFAST 11/13/20   Bacigalupo, Dionne Bucy, MD  lidocaine (XYLOCAINE) 5 % ointment Apply 1 application topically as needed. 12/14/20   Johnson, Megan P, DO  metroNIDAZOLE (METROGEL) 0.75 % gel APPLY A SMALL AMOUNT TO AFFECTED AREA EVERY EVENING 02/27/20   Ralene Bathe, MD  mometasone (ELOCON) 0.1 % cream APPLY TO AFFECTED AREAS/ RASH ONCE TO TWICE A DAY AS NEEDED 04/10/20   Ralene Bathe, MD  Multiple Vitamin  (MULTIVITAMIN) capsule Take 1 capsule by mouth daily.    [provider]  Multiple Vitamins-Minerals (OCUVITE PO) Take 120 mg by mouth daily.    [provider]  Multiple Vitamins-Minerals (PRESERVISION AREDS PO) Take by mouth.    [provider]  mupirocin ointment (BACTROBAN) 2 % Apply topically 2 (two) times daily. 12/14/20   Johnson, Megan P, DO  NIFEdipine (ADALAT CC) 30 MG 24 hr tablet Take 2 tablets (60 mg total) by mouth daily. 11/13/20   Virginia Crews, MD  nystatin-triamcinolone (MYCOLOG II) cream Apply 1 application topically 2 (two) times daily.    [provider]  Omega-3 Fatty Acids (FISH OIL) 1000 MG CAPS Take 3 capsules by mouth daily.    [provider]  OXYGEN Inhale into the lungs. 4 Liters O2 nightly    [provider]  triamcinolone ointment (KENALOG) 0.5 % Apply 1 application topically 2 (two) times daily. For rash on L ankle 05/27/18   Virginia Crews, MD    No Known Allergies  Family History  Problem Relation Age of Onset   Diabetes Mother     Social History Social History   Tobacco Use   Smoking status: Former    Types: Cigarettes    Quit date: 03/09/1974    Years since quitting: 46.8   Smokeless tobacco: Never  Vaping Use   Vaping Use: Never used  Substance Use Topics   Alcohol use: Never   Drug use: Never    Review of Systems Constitutional: Negative for fever. Cardiovascular: Negative for chest pain. Respiratory: Negative for shortness of breath. Gastrointestinal: Intermittent abdominal pain.  Positive for constipation x1 week.  No vomiting. Genitourinary: Mild intermittent incontinence.  No dysuria. Musculoskeletal: Negative for musculoskeletal complaints Neurological: Negative for headache All other ROS negative  ____________________________________________   PHYSICAL EXAM:  VITAL SIGNS: ED Triage Vitals  Enc Vitals Group     BP 12/28/20 0910 136/82     Pulse Rate 12/28/20  0910 73     Resp 12/28/20 0910 16     Temp 12/28/20 0910 98.1 F (36.7 C)     Temp Source 12/28/20 0910 Axillary     SpO2 12/28/20 0910 94 %     Weight 12/28/20 0911 164 lb (74.4 kg)     Height 12/28/20 0911 6' (1.829 m)     Head Circumference --      Peak Flow --      Pain Score 12/28/20 0911 8     Pain Loc --      Pain Edu? --      Excl. in [place]? --    Constitutional: Alert and oriented. Well appearing and in no distress. Eyes: Normal exam ENT      Head: Normocephalic and atraumatic.  Mouth/Throat: Mucous membranes are moist. Cardiovascular: Normal rate, regular rhythm.  Respiratory: Normal respiratory effort without tachypnea nor retractions. Breath sounds are clear Gastrointestinal: Soft, moderate lower abdominal tenderness/fullness. Musculoskeletal: Nontender with normal range of motion in all extremities. Neurologic:  Normal speech and language. No gross focal neurologic deficits  Skin:  Skin is warm, dry and intact.  Psychiatric: Mood and affect are normal.  ____________________________________________   RADIOLOGY  CT read pending  ____________________________________________   INITIAL IMPRESSION / ASSESSMENT AND PLAN / ED COURSE  Pertinent labs & imaging results that were available during my care of the patient were reviewed by me and considered in my medical decision making (see chart for details).   Patient presents emergency department for constipation, no bowel movement x1 week.  Patient has considerable fullness to the lower abdomen concern for possible urinary retention which could lead to constipation.  No reported dysuria.  We will proceed with lab work, urinalysis, CT scan to further evaluate.  I reviewed the CT images appears to have fairly significant urinary retention as well as fecal impaction.  I performed a digital disimpaction and removed a significant amount of stool from the patient's rectum.  We will place a Foley catheter.  Official radiology  read pending.  Samuel Jones was evaluated in Emergency Department on 12/28/2020 for the symptoms described in the history of present illness. He was evaluated in the context of the global COVID-19 pandemic, which necessitated consideration that the patient might be at risk for infection with the SARS-CoV-2 virus that causes COVID-19. Institutional protocols and algorithms that pertain to the evaluation of patients at risk for COVID-19 are in a state of rapid change based on information released by regulatory bodies including the CDC and federal and state organizations. These policies and algorithms were followed during the patient's care in the ED.  ____________________________________________   FINAL CLINICAL IMPRESSION(S) / ED DIAGNOSES  Fecal impaction Constipation Urinary retention   Harvest Dark, MD 12/29/20 1456

## 2020-12-28 NOTE — Telephone Encounter (Signed)
Noted  

## 2020-12-28 NOTE — ED Notes (Signed)
Disimpacted by EDP. RN at bedside. Pt tolerated well.

## 2020-12-28 NOTE — ED Notes (Signed)
Bladder scan showed >214ml. EDP informed.

## 2020-12-28 NOTE — ED Triage Notes (Signed)
Pt arrives to the er today from home with son who states its been over a week since his last bm. Son states that he was started on lactulose to help but hasn't, pt has never had to be seen for constipation in the past. Son states that the pt's wife died a week ago and he has been spiraling downward since, pt's clothing is dirty and son brought a change of clothes for the pt

## 2020-12-28 NOTE — Telephone Encounter (Signed)
Reason for Disposition  [1] Constant abdominal pain AND [2] present > 2 hours  Answer Assessment - Initial Assessment Questions 1. STOOL PATTERN OR FREQUENCY: "How often do you have a bowel movement (BM)?"  (Normal range: 3 times a day to every 3 days)  "When was your last BM?"       Patient has not had BM in over 1 week 2. STRAINING: "Do you have to strain to have a BM?"      No- not moving 3. RECTAL PAIN: "Does your rectum hurt when the stool comes out?" If Yes, ask: "Do you have hemorrhoids? How bad is the pain?"  (Scale 1-10; or mild, moderate, severe)     Not sure 4. STOOL COMPOSITION: "Are the stools hard?"     Has always used stool softeners  5. BLOOD ON STOOLS: "Has there been any blood on the toilet tissue or on the surface of the BM?" If Yes, ask: "When was the last time?"      no 6. CHRONIC CONSTIPATION: "Is this a new problem for you?"  If no, ask: "How long have you had this problem?" (days, weeks, months)      yes 7. CHANGES IN DIET OR HYDRATION: "Have there been any recent changes in your diet?" "How much fluids are you drinking on a daily basis?"  "How much have you had to drink today?"     Does not sufficient hydration 8. MEDICATIONS: "Have you been taking any new medications?" "Are you taking any narcotic pain medications?" (e.g., Vicodin, Percocet, morphine, Dilaudid)     Lactulose  9. LAXATIVES: "Have you been using any stool softeners, laxatives, or enemas?"  If yes, ask "What, how often, and when was the last time?"     Stool softener, lactulose  10. ACTIVITY:  "How much walking do you do every day?"  "Has your activity level decreased in the past week?"        Very sedentary  11. CAUSE: "What do you think is causing the constipation?"        Sedentary patient- decreased hydration 12. OTHER SYMPTOMS: "Do you have any other symptoms?" (e.g., abdominal pain, bloating, fever, vomiting)       Abdominal cramping with lactulose- but no movement  Protocols used:  Constipation-A-AH

## 2020-12-28 NOTE — ED Provider Notes (Signed)
Procedures     ----------------------------------------- 4:55 PM on 12/28/2020 ----------------------------------------- CT report available in PACS system: CLINICAL DATA: Abdominal distension. Constipation, 1 week since last bowel movement.  EXAM: CT ABDOMEN AND PELVIS WITH CONTRAST  TECHNIQUE: Multidetector CT imaging of the abdomen and pelvis was performed using the standard protocol following bolus administration of intravenous contrast.  CONTRAST: 147mL OMNIPAQUE IOHEXOL 300 MG/ML SOLN  COMPARISON: None.  FINDINGS: Lower chest: Ascending thoracic aortic aneurysm 4.6 cm in diameter. Mild aortic valve calcification. Left anterior descending coronary artery atherosclerotic calcification.  Scattered ground-glass opacities in the right lower lobe with associated peripheral interstitial accentuation and potentially some early peripheral honeycombing. Cylindrical bronchiectasis in the right lower lobe along with mild airway thickening. There is more fine reticulonodular interstitial accentuation in the left lower lobe and inferiorly in the left upper lobe.  Eventration of the right anterior hemidiaphragm which appears mildly elevated.  Borderline cardiomegaly.  Hepatobiliary: Prior cholecystectomy. Several small extrahepatic calcifications are present along the margin of the liver, potentially dystrophic calcifications although chronic spilled gallstones cannot be readily excluded. No significant abnormal hepatic enhancement.  Pancreas: Unremarkable  Spleen: Trace perisplenic ascites. The spleen appears normal.  Adrenals/Urinary Tract: Both adrenal glands appear normal. There is mild left hydronephrosis and left hydroureter potentially with some mildly delayed excretion from the left kidney, with the hydroureter extending down to the level of the iliac vessel cross over. Distended urinary bladder is unusually high in position, with the base of the bladder above  the level of the pubis, presumably reflecting a vertically elongated and flattened prostate gland. There is some mild fullness of the right collecting system extending to the iliac vessel cross over as well, although less than on the left. Small bilateral hypodense renal lesions are probably cysts but technically too small to characterize. Small bladder cellules are present.  3 mm left kidney lower pole nonobstructive renal calculus. 2 mm right kidney lower pole nonobstructive renal calculus.  Stomach/Bowel: Prominence of rectal stool favoring fecal impaction with a 9.8 cm ball of stool in the rectum. Mild rectal wall thickening with surrounding low-grade stranding raising the possibility of stercoral colitis. There is a colorectal anastomotic staple line which appears relatively unremarkable. Just proximal to this, there is abnormal edema and stranding around the sigmoid colon compatible with colitis, along with prominence of stool in this region as well. There are some diverticula in this vicinity but the diverticula do not appear to be the epicenter of this inflammatory stranding. Just proximal to the anastomotic staple line there is some narrowing of the colon as it extends between the iliac vessels in the distended urinary bladder. No gas is identified within the bowel wall. No portal venous gas observed.  There is prominence of stool in the splenic flexure and descending colon. However, the right colon and the proximal transverse colon do not appear substantially affected. The cecum is thought to be mobile and anterior to the liver. Surgically absent appendix.  Vascular/Lymphatic: Atherosclerosis is present, including aortoiliac atherosclerotic disease. There is some mild narrowing of the celiac trunk with poststenotic dilatation but no occlusion. The SMA appears patent. Inferior mesenteric artery patency distal to determine, but not definitively patent. Somewhat tortuous  abdominal aorta and iliac arteries.  Reproductive: The prostate gland appears vertically elongated and somewhat flattened, positioned between the rectum and the pubis, measuring about 5.9 by 5.8 by 2.0 cm (volume = 36 cm^3).  Other: No supplemental non-categorized findings.  Musculoskeletal: Fatty atrophy of the gluteus minimus musculature, right  proximal quadriceps musculature, and right hip adductor musculature. Mild fatty atrophy of the left hip adductor musculature.  Dextroconvex lumbar scoliosis with rotary component. There is substantial lumbar spondylosis and degenerative disc disease leading to multilevel impingement.  IMPRESSION: 1. Complex appearance of the distal large bowel including prominence of stool in the descending colon, sigmoid colon and rectum (including a large rectal stool ball suggesting fecal impaction); wall thickening in the rectum which could indicate stercoral colitis; and abnormal inflammatory stranding along the sigmoid colon suspicious for a sigmoid colitis. Moreover, there is an unusual distended and elevated urinary bladder causing narrowing of the sigmoid colon as it passes between the distended urinary bladder and the left iliac vessels, just above the level of the colorectal anastomosis. I do not see extraluminal gas, pneumatosis, or portal venous gas to necessarily indicate ischemia or perforation. 2. Unusual configuration of the urinary bladder which is distended and somewhat cephalad in position, with vertical elongation of the prostate gland. Some of this may be from chronic rectal distension. 3. The celiac trunk and SMA appear patent, patency of the inferior mesenteric artery is questionable. 4. Ascending thoracic aortic aneurysm 4.6 cm in diameter. Recommend semi-annual imaging followup by CTA or MRA and referral to cardiothoracic surgery if not already obtained. This recommendation follows 2010  ACCF/AHA/AATS/ACR/ASA/SCA/SCAI/SIR/STS/SVM Guidelines for the Diagnosis and Management of Patients With Thoracic Aortic Disease. Circulation. 2010; 121: T888-K800. Aortic aneurysm NOS (ICD10-I71.9) 5. Mild bilateral hydronephrosis and hydroureter extending to the level of the iliac vessel crossover, but without ureteral stone identified. 6. Other imaging findings of potential clinical significance: Aortic valve calcification. Left anterior descending coronary artery atherosclerosis. Aortic Atherosclerosis (ICD10-I70.0). Probably chronic peripheral ground-glass opacity and honeycombing in the right lower lobe with cylindrical bronchiectasis and mild airway thickening, a component of alveolitis is difficult to exclude. Anterior eventration of the right hemidiaphragm which is mildly elevated. Nonspecific trace perisplenic ascites. Single bilateral nonobstructive lower pole calculi. Fatty atrophy of pelvic musculature is noted above. Dextroconvex lumbar scoliosis with rotary component. Multilevel lumbar impingement.     After images were obtained, Dr. Kerman Passey manually disimpacted stool and ordered foley catheter and enema. Will follow up response and current symptoms.  ----------------------------------------- 9:50 PM on 12/28/2020 ----------------------------------------- Patient has had a large bowel movement and feels much better, pain is resolved.  Stable for discharge home to follow-up with PCP and urology.     Carrie Mew, MD 12/28/20 2150

## 2020-12-28 NOTE — Telephone Encounter (Signed)
Patient's son is calling- no bowel movement with lactulose treatment. It has now been 1 week. Patient has abdominal cramping but no BM. Son states patient has limited hydration. Advised ED for evaluation- possible impaction.

## 2020-12-28 NOTE — ED Notes (Signed)
Updated pt and family member, awaiting soap suds enema to be done. Both verbalized understanding

## 2020-12-28 NOTE — ED Notes (Signed)
MD at the bedside  

## 2020-12-28 NOTE — ED Notes (Signed)
Report received from Ally, RN.

## 2020-12-28 NOTE — ED Notes (Signed)
500cc urine out immediately; clamped for 15 minutes. Then unclamped.

## 2020-12-28 NOTE — ED Notes (Signed)
Soap suds enema performed. Pt had a moderate amount of soft, pasty stool before and after the enema. Cleaned pt up, changed sheets and chuck pads. Brief placed on patient. Pt states he feels like he still has to have a bowel movement. Explained that it is normal to have the urge after an enema. Pt tolerated well. MD notified and aware of results of enema.

## 2020-12-29 NOTE — Telephone Encounter (Signed)
Requested medications are due for refill today yes  Requested medications are on the active medication list yes  Last refill 09/17/20  Last visit 01/17/20  Future visit scheduled NO, last uric acid 11/25/17 which fails protocol.  Notes to clinic Failed protocol due to no valid lab within 360 days (uric acid), please assess.  Requested Prescriptions  Pending Prescriptions Disp Refills   allopurinol (ZYLOPRIM) 300 MG tablet [Pharmacy Med Name: ALLOPURINOL 300MG  TABLETS] 90 tablet 0    Sig: TAKE 1 TABLET BY MOUTH EVERY DAY     Endocrinology:  Gout Agents Failed - 12/28/2020  1:44 PM      Failed - Uric Acid in normal range and within 360 days    Uric Acid  Date Value Ref Range Status  11/25/2017 4.5 3.7 - 8.6 mg/dL Final    Comment:               Therapeutic target for gout patients: <6.0          Passed - Cr in normal range and within 360 days    Creatinine, Ser  Date Value Ref Range Status  12/28/2020 1.13 0.61 - 1.24 mg/dL Final          Passed - Valid encounter within last 12 months    Recent Outpatient Visits           2 weeks ago Pressure injury of sacral region, stage 2 ([place])   [place], Megan P, DO   5 months ago May, Vickki Muff, PA-C   1 year ago Hypothyroidism, unspecified type   [employer], Dionne Bucy, MD   1 year ago Essential hypertension   [place], Dionne Bucy, MD   1 year ago Encounter for [employer] annual wellness exam   [facility] [place], [place], LPN

## 2020-12-30 LAB — URINE CULTURE: Culture: >=100000 — AB

## 2021-01-01 ENCOUNTER — Telehealth: Payer: Self-pay

## 2021-01-01 NOTE — Telephone Encounter (Signed)
Copied from [place] [id number]. Topic: General - Other >> Jan 01, 2021  8:43 AM Celene Kras wrote: Reason for CRM: Pt's son, Randall Hiss, called stating that a Melissa from Hospice will be coming into the office today. He states that she will be taking over care for pt for a while and is needing to have forms. Randall Hiss states that he does authorize for her to have/ pick up info regarding pt. Please advise.

## 2021-01-03 ENCOUNTER — Other Ambulatory Visit: Payer: Self-pay

## 2021-01-03 ENCOUNTER — Ambulatory Visit: Payer: Medicare Other | Admitting: Physician Assistant

## 2021-01-03 DIAGNOSIS — R339 Retention of urine, unspecified: Secondary | ICD-10-CM

## 2021-01-03 DIAGNOSIS — N390 Urinary tract infection, site not specified: Secondary | ICD-10-CM

## 2021-01-03 LAB — BLADDER SCAN AMB NON-IMAGING: Scan Result: >269

## 2021-01-04 NOTE — Progress Notes (Signed)
01/03/2021 8:25 AM   [name] Jan 26, 1934 [id number]  CC: Chief Complaint  Patient presents with   Recurrent UTI   HPI: Samuel Jones is a 87 y.o. comorbid male with PMH recurrent UTI who presents today for voiding trial.  He was seen in the emergency department 6 days ago with fecal impaction requiring manual disimpaction and acute urinary retention requiring Foley catheter placement.  He is accompanied today by his son, who contributes to HPI.  Today his son reports that he has continued to have bowel movements since leaving the emergency department.  Overall, he reports that the patient has had significant decline in his functional status over the past 4 to 6 weeks coinciding with the death of his wife of 30 years.  He does not have a history of urinary retention.  He is scheduled for an intake appointment with hospice tomorrow morning.  Fill and pull voiding trial performed in clinic in the morning with low urinary output.  He elected to return in the afternoon for repeat PVR.  Upon his return, he reported drinking several glasses of water and orange juice.  He has not been able to urinate and feels the urge to do so.  Bladder scan with >251mL.  PMH: Past Medical History:  Diagnosis Date   Anxiety    Cataract    Colon cancer ([place]) 2012   Gout    Hyperlipidemia    Hypertension    Melanoma ([place])    Oxygen deficiency    Post-polio syndrome    Stroke [facility])    Thyroid disease     Surgical History: Past Surgical History:  Procedure Laterality Date   APPENDECTOMY     CARPAL TUNNEL RELEASE     R wrist   CATARACT EXTRACTION Bilateral    CHOLECYSTECTOMY     COLON SURGERY     FRACTURE SURGERY     R knee and L ankle   HERNIA REPAIR     MELANOMA EXCISION      Home Medications:  Allergies as of 01/03/2021   No Known Allergies      Medication List        Accurate as of January 03, 2021 11:59 PM. If you have any questions, ask your nurse or doctor.           acetaminophen 325 MG tablet Commonly known as: TYLENOL Take 650 mg by mouth every 6 (six) hours as needed.   allopurinol 300 MG tablet Commonly known as: ZYLOPRIM TAKE 1 TABLET BY MOUTH EVERY DAY   aspirin EC 81 MG tablet Take 81 mg by mouth daily.   atenolol 25 MG tablet Commonly known as: TENORMIN Take 1 tablet (25 mg total) by mouth daily.   cefdinir 300 MG capsule Commonly known as: OMNICEF Take 1 capsule (300 mg total) by mouth 2 (two) times daily.   colchicine 0.6 MG tablet TAKE 1 TABLET(0.6 MG) BY MOUTH DAILY   donepezil 10 MG tablet Commonly known as: ARICEPT TAKE 1 TABLET BY MOUTH EVERY DAY IN THE EVENING   Fish Oil 1000 MG Caps Take 3 capsules by mouth daily.   FLUoxetine 20 MG tablet Commonly known as: PROZAC Take 20 mg by mouth daily.   fluticasone 50 MCG/ACT nasal spray Commonly known as: FLONASE Place into both nostrils daily.   ketoconazole 2 % shampoo Commonly known as: NIZORAL SHAMPOO WITH AS DIRECTED AS DIRECTED SHAMPOO SCALP 2 TIMES A WEEK ON MONDAY AND FRIDAY MORNINGS, LET SIT 15 MINUTES AND RINSE  OUT   lactulose 10 GM/15ML solution Commonly known as: CHRONULAC Take 45 mLs (30 g total) by mouth 2 (two) times daily as needed for severe constipation.   levothyroxine 100 MCG tablet Commonly known as: SYNTHROID TAKE 1 TABLET BY MOUTH EVERY DAY BEFORE BREAKFAST   lidocaine 5 % ointment Commonly known as: XYLOCAINE Apply 1 application topically as needed.   metroNIDAZOLE 0.75 % gel Commonly known as: METROGEL APPLY A SMALL AMOUNT TO AFFECTED AREA EVERY EVENING   mometasone 0.1 % cream Commonly known as: ELOCON APPLY TO AFFECTED AREAS/ RASH ONCE TO TWICE A DAY AS NEEDED   multivitamin capsule Take 1 capsule by mouth daily.   mupirocin ointment 2 % Commonly known as: BACTROBAN Apply topically 2 (two) times daily.   NIFEdipine 30 MG 24 hr tablet Commonly known as: ADALAT CC Take 2 tablets (60 mg total) by mouth daily.    nystatin-triamcinolone cream Commonly known as: MYCOLOG II Apply 1 application topically 2 (two) times daily.   OXYGEN Inhale into the lungs. 4 Liters O2 nightly   PRESERVISION AREDS PO Take by mouth.   OCUVITE PO Take 120 mg by mouth daily.   triamcinolone ointment 0.5 % Commonly known as: KENALOG Apply 1 application topically 2 (two) times daily. For rash on L ankle        Allergies:  No Known Allergies  Family History: Family History  Problem Relation Age of Onset   Diabetes Mother     Social History:   reports that he quit smoking about 46 years ago. His smoking use included cigarettes. He has never used smokeless tobacco. He reports that he does not drink alcohol and does not use drugs.  Physical Exam: There were no vitals taken for this visit.  Constitutional:  Alert and oriented, no acute distress, nontoxic appearing HEENT: [place], AT Cardiovascular: No clubbing, cyanosis, or edema Respiratory: Normal respiratory effort, no increased work of breathing GU: Uncircumcised penis.  Mild to moderate phimosis and edematous foreskin.  Able to visualize the urethral meatus, however difficulty with insertion orthotopically.  Able to insert both lidocaine jelly and urethral catheter along the ventral aspect of the meatus consistent with hypospadias, however unable to verify this visually due to phimosis. Skin: No rashes, bruises or suspicious lesions Neurologic: Grossly intact, no focal deficits, moving all 4 extremities Psychiatric: Normal mood and affect  Laboratory Data: Results for orders placed or performed in visit on 01/03/21  BLADDER SCAN AMB NON-IMAGING  Result Value Ref Range   Scan Result >269 ml    Fill and Pull Catheter Removal  Patient is present today for a catheter removal.  Patient was cleaned and prepped in a sterile fashion 265ml of sterile water/ saline was instilled into the bladder when the patient felt the urge to urinate. 62ml of water was then  drained from the balloon.  A 16FR foley cath was removed from the bladder no complications were noted .  Patient was then given some time to void on their own.  Patient can void 2ml on their own after some time.  Patient tolerated well.  Performed by: Debroah Loop, PA-C   Simple Catheter Placement  Due to urinary retention patient is present today for a foley cath placement.  Patient was cleaned and prepped in a sterile fashion with betadine and 2% lidocaine jelly was instilled into the urethra. A 16 FR coude foley catheter was inserted, urine return was noted  281ml, urine was yellow in color.  The balloon was filled  with 10cc of sterile water.  A night bag was attached for drainage. Patient was given instruction on proper catheter care.  Patient tolerated well, no complications were noted   Performed by: Debroah Loop, PA-C and Kerman Passey, CMA  Assessment & Plan:   1. Urinary retention Originally likely secondary to concurrent fecal impaction, now resolved.  Patient unfortunately failed his voiding trial today with equivocal follow-up bladder scan but reports of the inability to urinate despite the urge to do so.  We discussed various options including catheter replacement with repeat voiding trial in 1 week versus repeat PVR tomorrow morning.  Given patient's plans for hospice eval tomorrow morning, they elected for the former, see procedure note above for more details.  We discussed that if he is unable to void spontaneously, treatment options would include chronic indwelling Foley catheter and that this may be managed at home by hospice nursing.  I do not recommend starting Flomax in this comorbid patient with declining functional status due to concerns for increased fall risk. - BLADDER SCAN AMB NON-IMAGING  Return in about 1 week (around 01/10/2021) for Voiding trial.  Debroah Loop, PA-C  [facility] [address], [place], [place] [address] [phone]

## 2021-01-09 ENCOUNTER — Ambulatory Visit: Payer: Medicare Other | Admitting: Physician Assistant

## 2021-01-10 IMAGING — US VENOUS DOPPLER ULTRASOUND OF LEFT LOWER EXTREMITY
1 series · 13 of 24 positions shown · non-contrast
Comparison: None.

CLINICAL DATA: Left lower extremity pain, edema and erythema.



[Series 1: venous doppler ultrasound of left lower extremity · 13 of 33 slices shown]
[im 1/33]
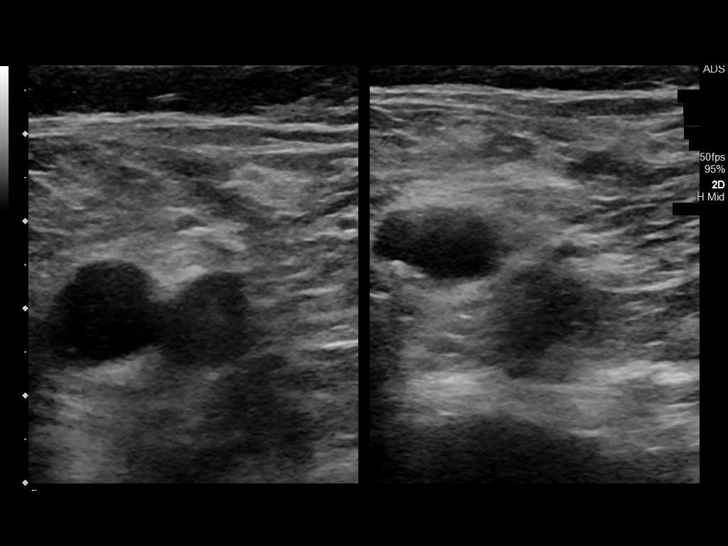
[im 3/33]
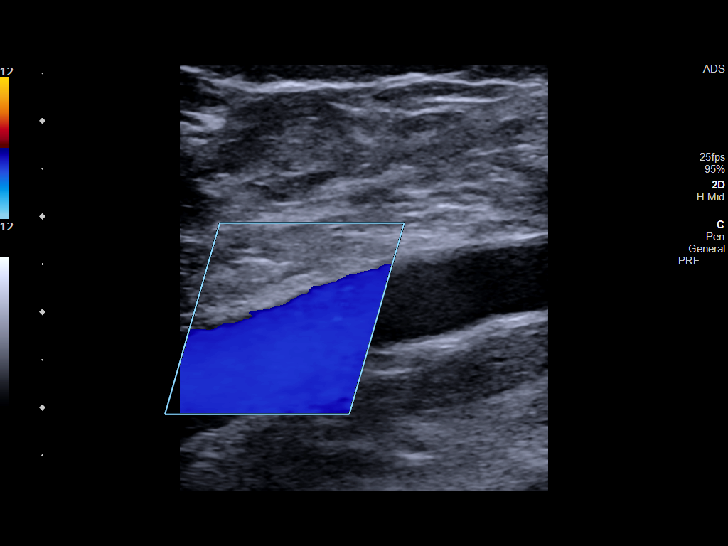
[im 6/33]
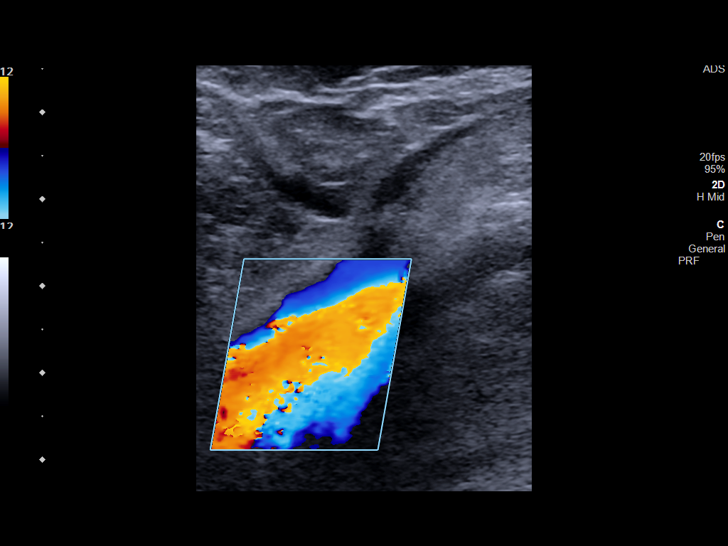
[im 9/33]
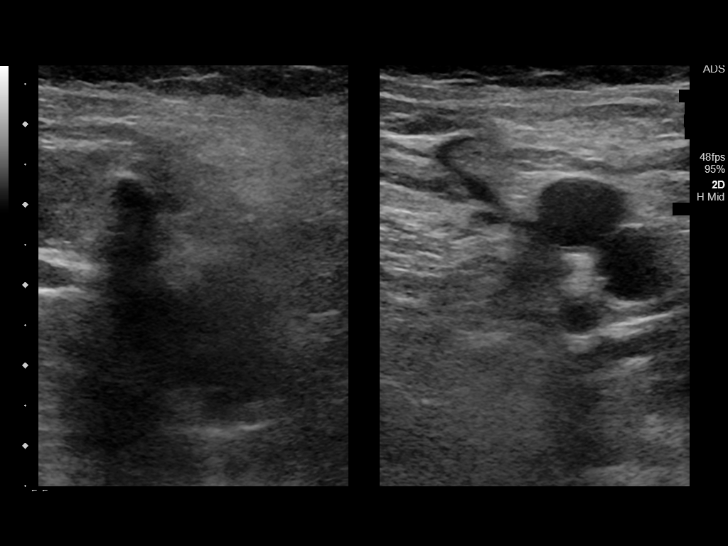
[im 12/33]
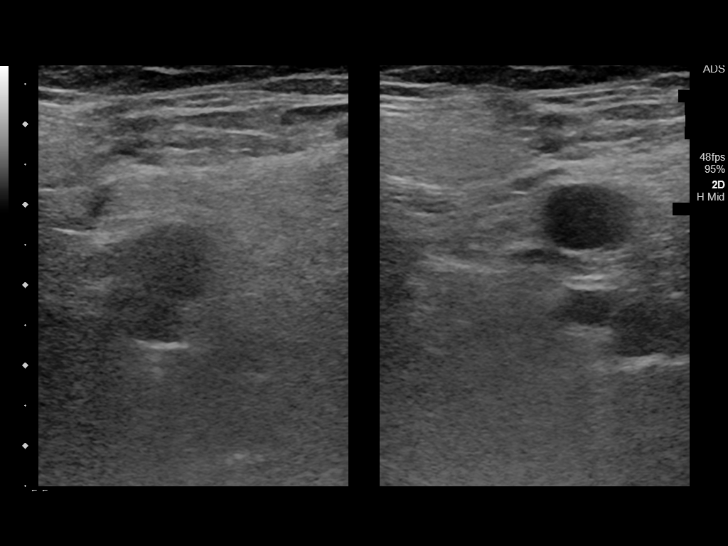
[im 14/33]
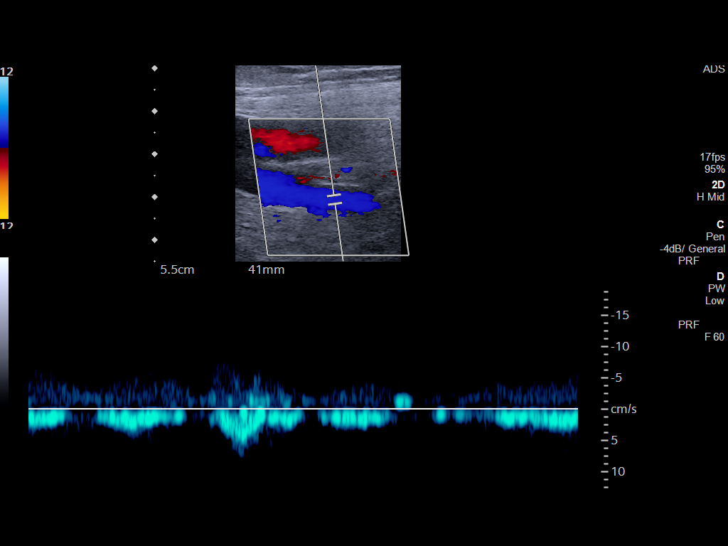
[im 17/33]
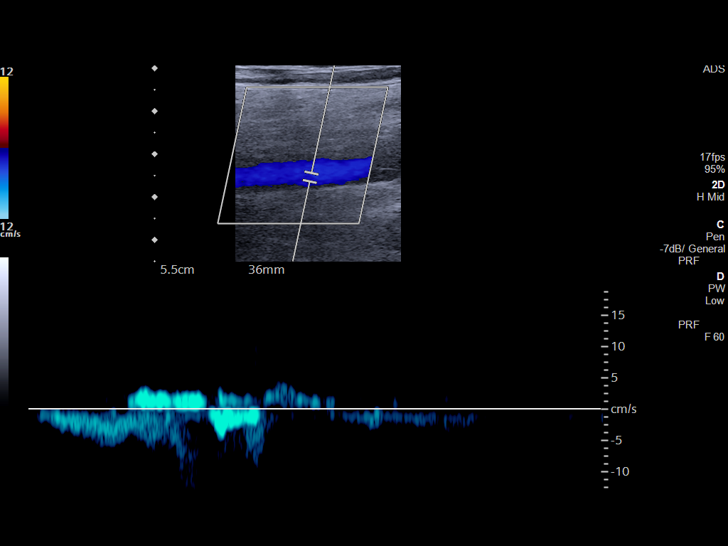
[im 19/33]
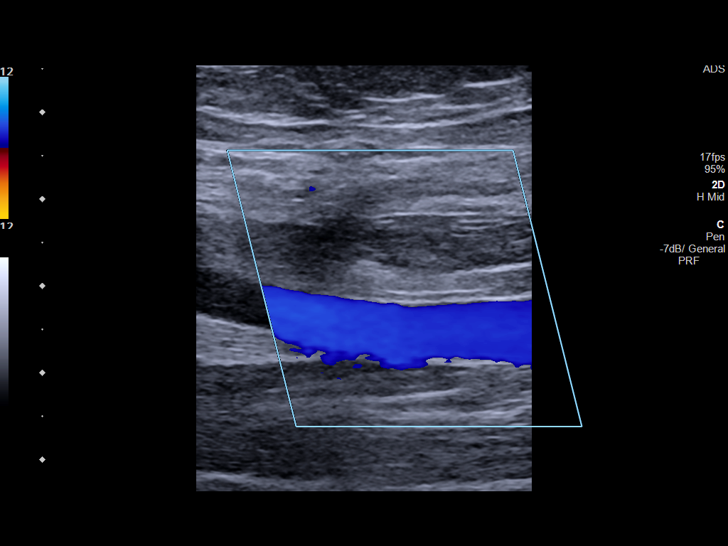
[im 21/33]
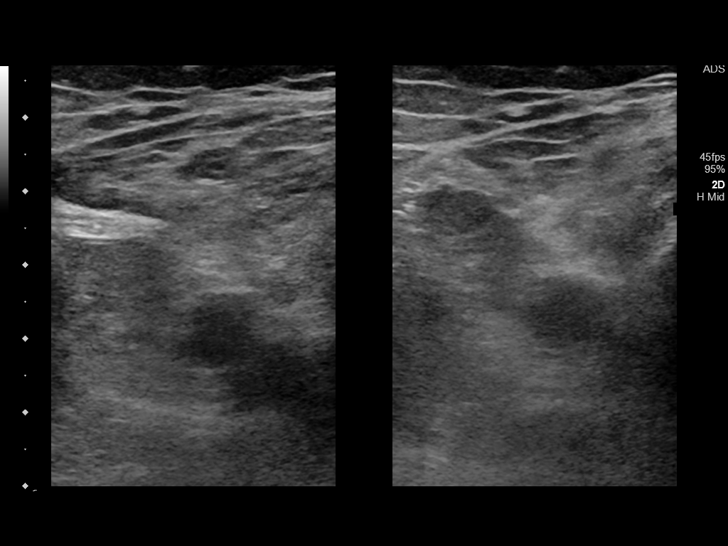
[im 24/33]
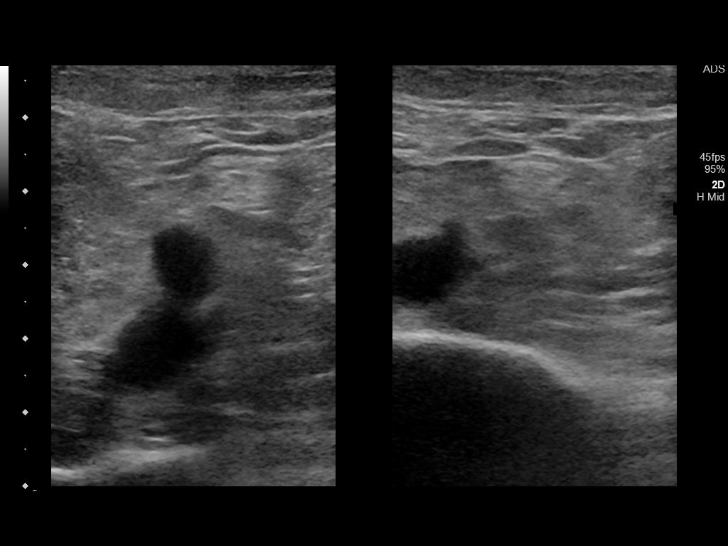
[im 27/33]
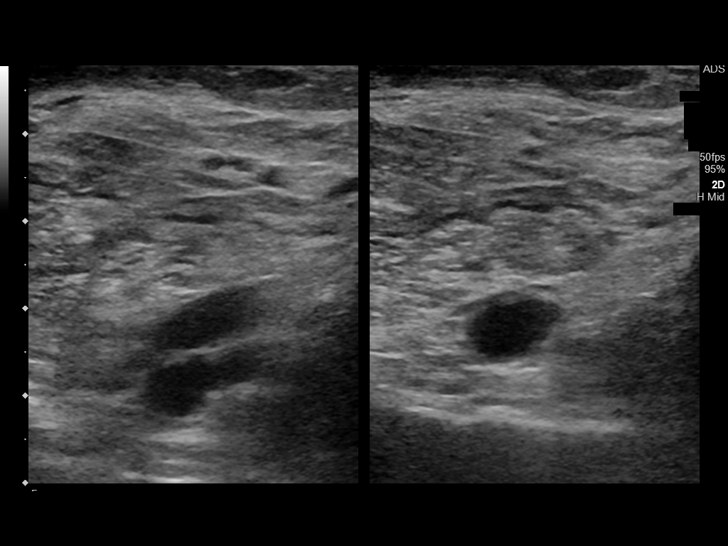
[im 30/33]
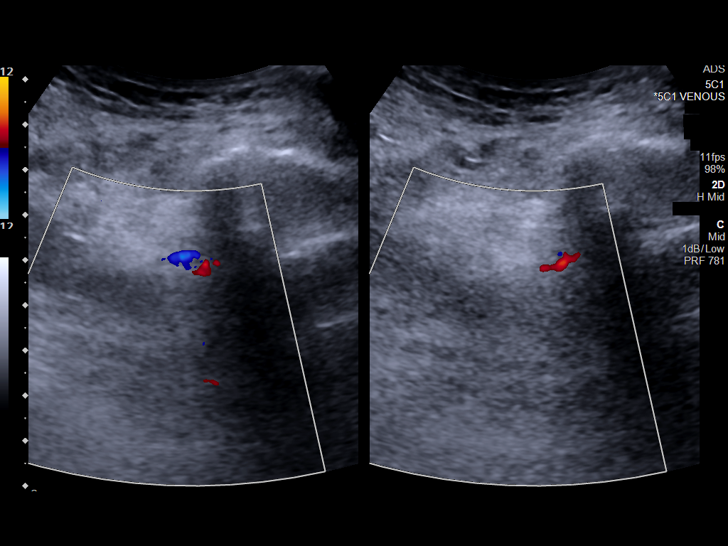
[im 33/33]
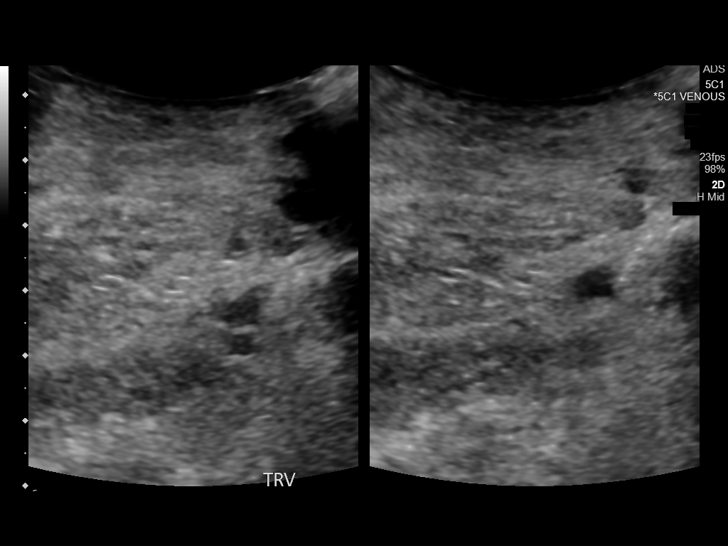

[13 of 24 positions shown; findings below may reference images not displayed]

FINDINGS: Contralateral Common Femoral Vein: Respiratory phasicity is normal
and symmetric with the symptomatic side. No evidence of thrombus.
Normal compressibility.

Common Femoral Vein: No evidence of thrombus. Normal
compressibility, respiratory phasicity and response to augmentation.

Saphenofemoral Junction: No evidence of thrombus. Normal
compressibility and flow on color Doppler imaging.

Profunda Femoral Vein: No evidence of thrombus. Normal
compressibility and flow on color Doppler imaging.

Femoral Vein: No evidence of thrombus. Normal compressibility,
respiratory phasicity and response to augmentation.

Popliteal Vein: No evidence of thrombus. Normal compressibility,
respiratory phasicity and response to augmentation.

Calf Veins: No evidence of thrombus. Normal compressibility and flow
on color Doppler imaging.

Superficial Great Saphenous Vein: No evidence of thrombus. Normal
compressibility.

Venous Reflux:  None.

Other Findings: No evidence of superficial thrombophlebitis or
abnormal fluid collection.
IMPRESSION: No evidence of left lower extremity deep venous thrombosis.

## 2021-02-07 DEATH — deceased

## 2021-02-08 ENCOUNTER — Other Ambulatory Visit: Payer: Self-pay | Admitting: Family Medicine

## 2021-02-08 NOTE — Telephone Encounter (Signed)
Patient needs an appointment for future refills.  Please schedule.

## 2021-02-09 NOTE — Telephone Encounter (Signed)
Requested medication (s) are due for refill today: yes  Requested medication (s) are on the active medication list: yes  Last refill:  11/10/20  Future visit scheduled: no  Notes to clinic:  has been seen in office but HTN not addressed since visit 12/2019, no upcoming visit, please assess.      Requested Prescriptions  Pending Prescriptions Disp Refills   atenolol (TENORMIN) 25 MG tablet [Pharmacy Med Name: ATENOLOL 25MG  TABLETS] 90 tablet 1    Sig: TAKE 1 TABLET(25 MG) BY MOUTH DAILY     Cardiovascular:  Beta Blockers Passed - 02/08/2021  6:37 AM      Passed - Last BP in normal range    BP Readings from Last 1 Encounters:  12/28/20 129/77          Passed - Last Heart Rate in normal range    Pulse Readings from Last 1 Encounters:  12/28/20 90          Passed - Valid encounter within last 6 months    Recent Outpatient Visits           1 month ago Pressure injury of sacral region, stage 2 ([place])   [place], Megan P, DO   6 months ago [place], Vickki Muff, PA-C   1 year ago Hypothyroidism, unspecified type   [facility], Dionne Bucy, MD   1 year ago Essential hypertension   [place], Dionne Bucy, MD   1 year ago Encounter for Medicare annual wellness exam   [facility] [place], [place], LPN

## 2021-02-12 ENCOUNTER — Telehealth: Payer: Medicare Other | Admitting: Urology

## 2021-02-18 ENCOUNTER — Other Ambulatory Visit: Payer: Self-pay | Admitting: Family Medicine

## 2021-02-18 MED ORDER — DONEPEZIL HCL 10 MG PO TABS: ORAL_TABLET | ORAL | 1 refills | Status: AC

## 2021-02-18 NOTE — Telephone Encounter (Signed)
[facility] faxed refill request for the following medications:  donepezil (ARICEPT) 10 MG tablet   Please advise.

## 2021-02-18 NOTE — Telephone Encounter (Signed)
LOV:07/23/20 LOV w/[facility] for acute: 12/14/2020 Last Refill: 08/09/2020 FOV: none

## 2021-05-15 ENCOUNTER — Encounter: Payer: Medicare Other | Admitting: Dermatology

## 2023-03-23 IMAGING — CT CT ABD-PELV W/ CM
2 of 5 series · 12 of 46 positions shown, 14 images · IV contrast (APPLIED)
Comparison: None.

CLINICAL DATA: Abdominal distension. Constipation, 1 week since
last bowel movement.

EXAM:
CT ABDOMEN AND PELVIS WITH CONTRAST
TECHNIQUE: Multidetector CT imaging of the abdomen and pelvis was performed
using the standard protocol following bolus administration of
intravenous contrast.
CONTRAST:  100mL OMNIPAQUE IOHEXOL 300 MG/ML  SOLN

[Series 2: routine abd/pel with · axial · 0.91mm/px · z∈[-519,-44]mm · 9 of 107 slices shown, 11 images]
[im 6/107  soft-tissue]
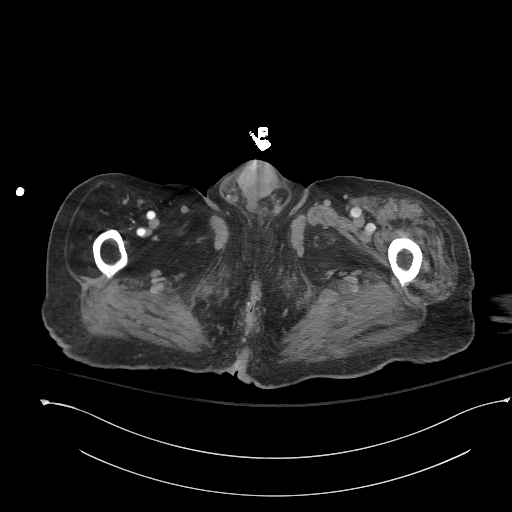
[im 6/107  bone]
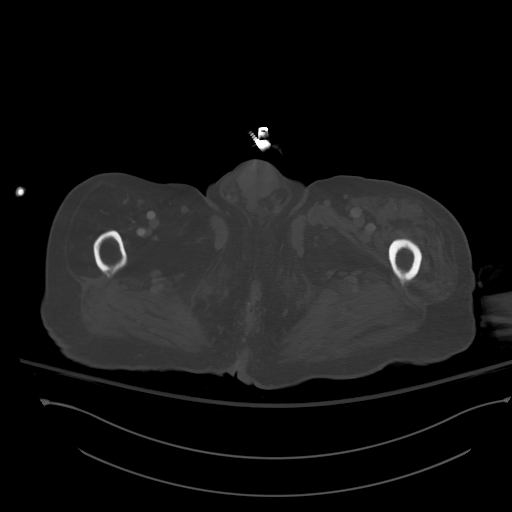
[im 18/107  soft-tissue]
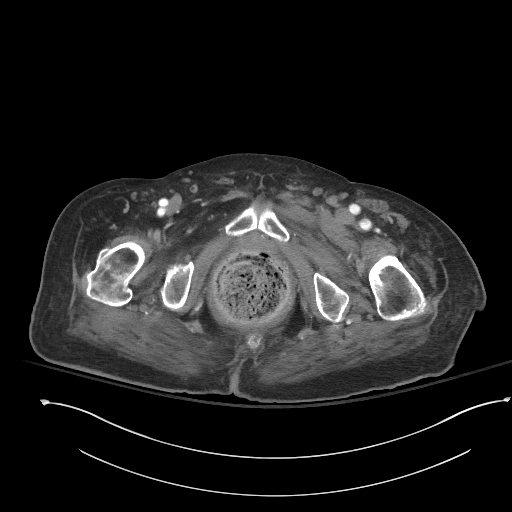
[im 30/107  soft-tissue]
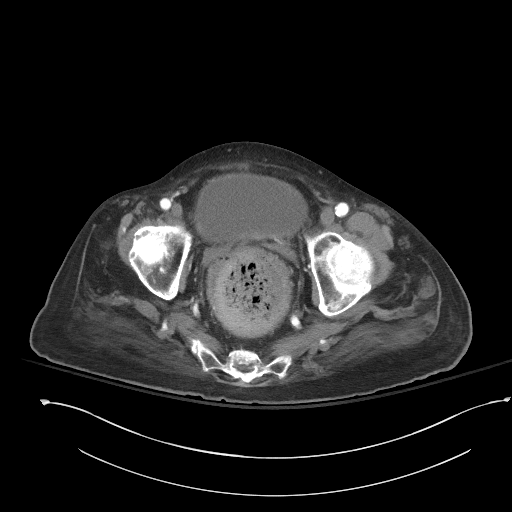
[im 42/107  soft-tissue]
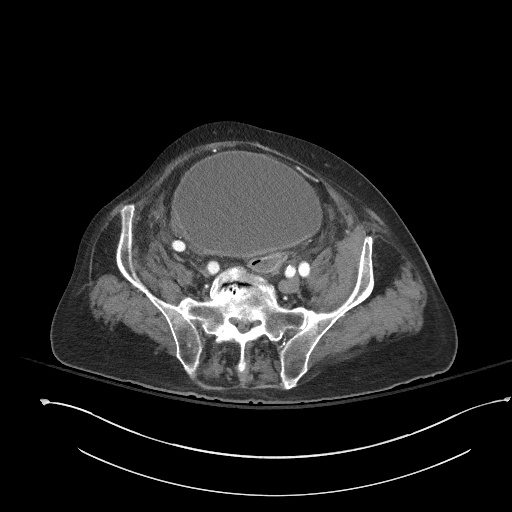
[im 54/107  soft-tissue]
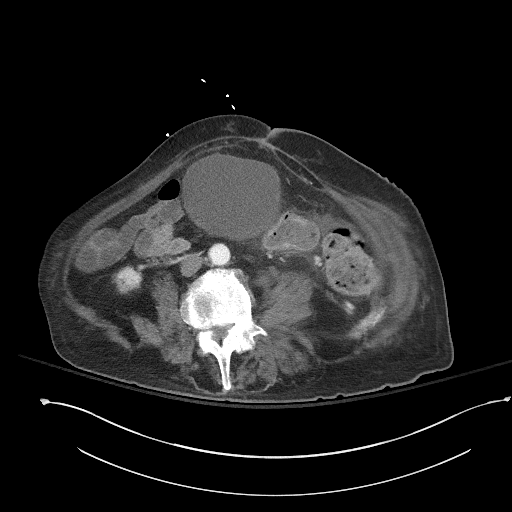
[im 65/107  soft-tissue]
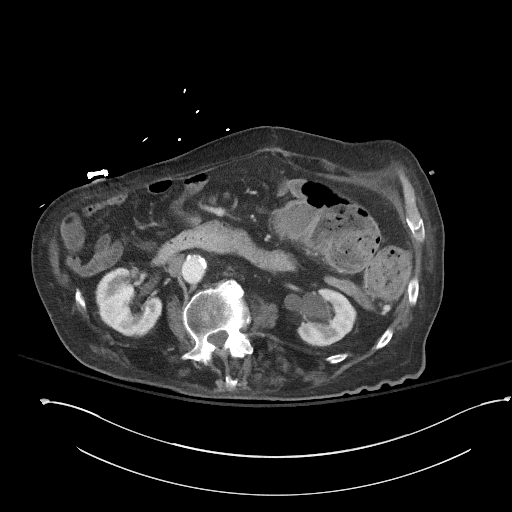
[im 77/107  soft-tissue]
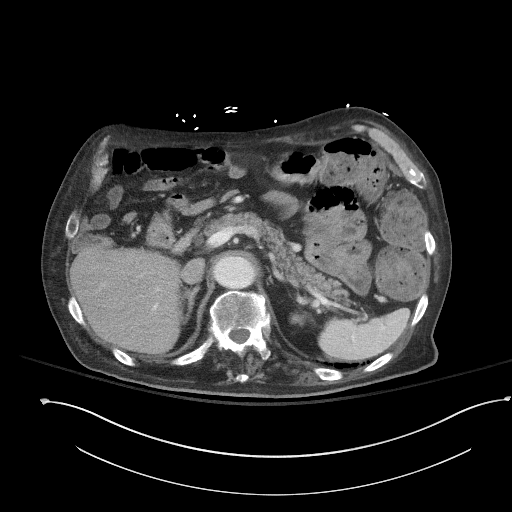
[im 89/107  soft-tissue]
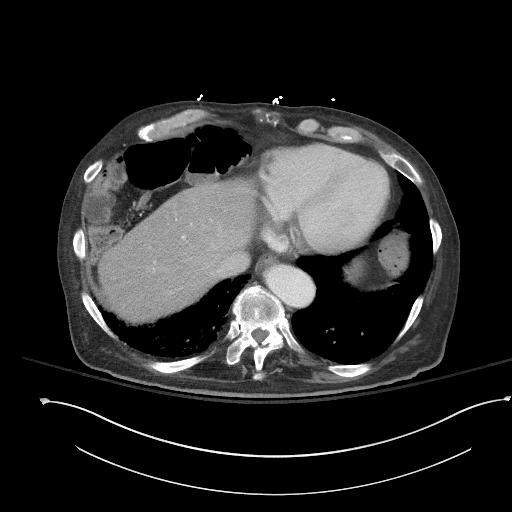
[im 101/107  soft-tissue]
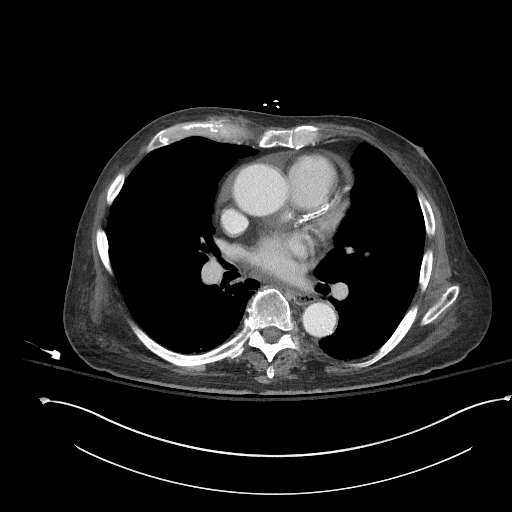
[im 101/107  bone]
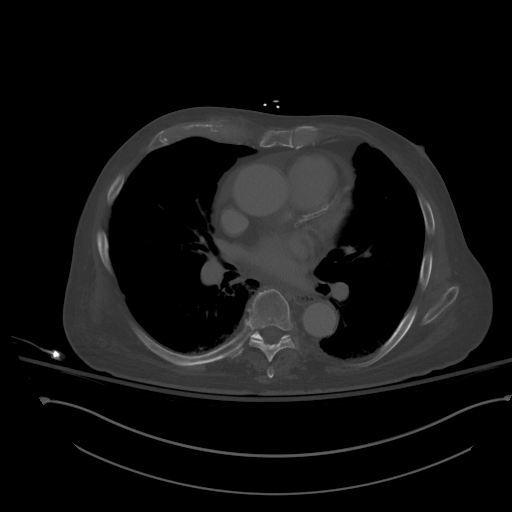

[Series 5: coronal st · coronal · 0.90mm/px · 3 of 88 slices shown]
[im 30/88  soft-tissue]
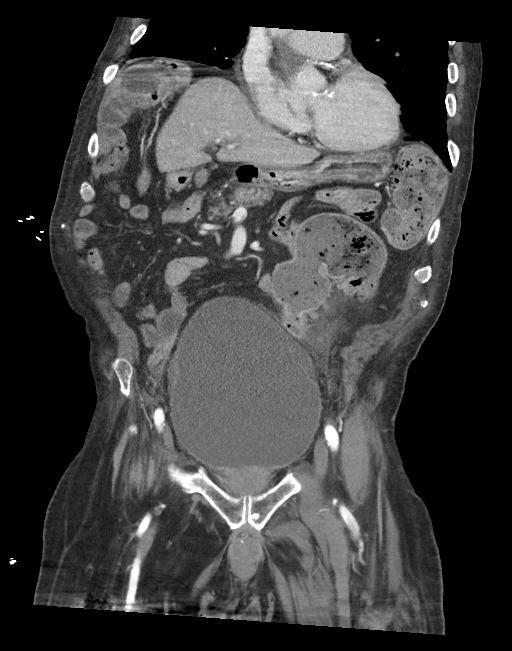
[im 39/88  soft-tissue]
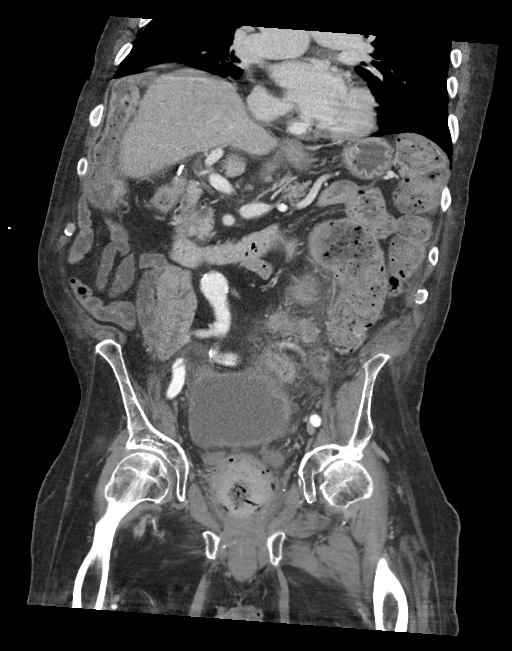
[im 49/88  soft-tissue]
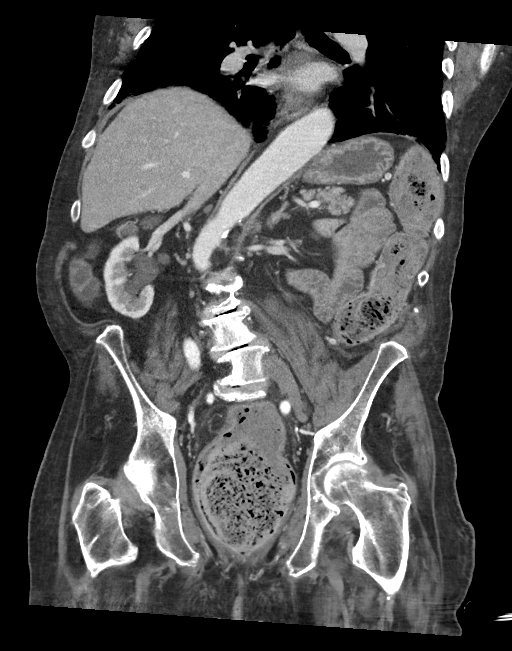

[12 of 46 positions shown; findings below may reference images not displayed]

FINDINGS: Lower chest: Ascending thoracic aortic aneurysm 4.6 cm in diameter.
Mild aortic valve calcification. Left anterior descending coronary
artery atherosclerotic calcification.

Scattered ground-glass opacities in the right lower lobe with
associated peripheral interstitial accentuation and potentially some
early peripheral honeycombing. Cylindrical bronchiectasis in the
right lower lobe along with mild airway thickening. There is more
fine reticulonodular interstitial accentuation in the left lower
lobe and inferiorly in the left upper lobe.

Eventration of the right anterior hemidiaphragm which appears mildly
elevated.

Borderline cardiomegaly.

Hepatobiliary: Prior cholecystectomy. Several small extrahepatic
calcifications are present along the margin of the liver,
potentially dystrophic calcifications although chronic spilled
gallstones cannot be readily excluded. No significant abnormal
hepatic enhancement.

Pancreas: Unremarkable

Spleen: Trace perisplenic ascites.  The spleen appears normal.

Adrenals/Urinary Tract: Both adrenal glands appear normal. There is
mild left hydronephrosis and left hydroureter potentially with some
mildly delayed excretion from the left kidney, with the hydroureter
extending down to the level of the iliac vessel cross over.
Distended urinary bladder is unusually high in position, with the
base of the bladder above the level of the pubis, presumably
reflecting a vertically elongated and flattened prostate gland.
There is some mild fullness of the right collecting system extending
to the iliac vessel cross over as well, although less than on the
left. Small bilateral hypodense renal lesions are probably cysts but
technically too small to characterize. Small bladder cellules are
present.

3 mm left kidney lower pole nonobstructive renal calculus. 2 mm
right kidney lower pole nonobstructive renal calculus.

Stomach/Bowel: Prominence of rectal stool favoring fecal impaction
with a 9.8 cm ball of stool in the rectum. Mild rectal wall
thickening with surrounding low-grade stranding raising the
possibility of stercoral colitis. There is a colorectal anastomotic
staple line which appears relatively unremarkable. Just proximal to
this, there is abnormal edema and stranding around the sigmoid colon
compatible with colitis, along with prominence of stool in this
region as well. There are some diverticula in this vicinity but the
diverticula do not appear to be the epicenter of this inflammatory
stranding. Just proximal to the anastomotic staple line there is
some narrowing of the colon as it extends between the iliac vessels
in the distended urinary bladder. No gas is identified within the
bowel wall. No portal venous gas observed.

There is prominence of stool in the splenic flexure and descending
colon. However, the right colon and the proximal transverse colon do
not appear substantially affected. The cecum is thought to be mobile
and anterior to the liver. Surgically absent appendix.

Vascular/Lymphatic: Atherosclerosis is present, including aortoiliac
atherosclerotic disease. There is some mild narrowing of the celiac
trunk with poststenotic dilatation but no occlusion. The SMA appears
patent. Inferior mesenteric artery patency distal to determine, but
not definitively patent. Somewhat tortuous abdominal aorta and iliac
arteries.

Reproductive: The prostate gland appears vertically elongated and
somewhat flattened, positioned between the rectum and the pubis,
measuring about 5.9 by 5.8 by 2.0 cm (volume = 36 cm^3).

Other: No supplemental non-categorized findings.

Musculoskeletal: Fatty atrophy of the gluteus minimus musculature,
right proximal quadriceps musculature, and right hip adductor
musculature. Mild fatty atrophy of the left hip adductor
musculature.

Dextroconvex lumbar scoliosis with rotary component. There is
substantial lumbar spondylosis and degenerative disc disease leading
to multilevel impingement.
IMPRESSION: 1. Complex appearance of the distal large bowel including prominence
of stool in the descending colon, sigmoid colon and rectum
(including a large rectal stool ball suggesting fecal impaction);
wall thickening in the rectum which could indicate stercoral
colitis; and abnormal inflammatory stranding along the sigmoid colon
suspicious for a sigmoid colitis. Moreover, there is an unusual
distended and elevated urinary bladder causing narrowing of the
sigmoid colon as it passes between the distended urinary bladder and
the left iliac vessels, just above the level of the colorectal
anastomosis. I do not see extraluminal gas, pneumatosis, or portal
venous gas to necessarily indicate ischemia or perforation.
2. Unusual configuration of the urinary bladder which is distended
and somewhat cephalad in position, with vertical elongation of the
prostate gland. Some of this may be from chronic rectal distension.
3. The celiac trunk and SMA appear patent, patency of the inferior
mesenteric artery is questionable.
4. Ascending thoracic aortic aneurysm 4.6 cm in diameter. Recommend
semi-annual imaging followup by CTA or MRA and referral to
cardiothoracic surgery if not already obtained. This recommendation
follows 5323 ACCF/AHA/AATS/ACR/ASA/SCA/SOLDEVILLA/ALEXSADNDRA/BOOGIE/KE[place]Y Guidelines
for the Diagnosis and Management of Patients With Thoracic Aortic
Disease. Circulation. 5323; 121: [id number]. Aortic aneurysm NOS
([id number].Q)
5. Mild bilateral hydronephrosis and hydroureter extending to the
level of the iliac vessel crossover, but without ureteral stone
identified.
6. Other imaging findings of potential clinical significance: Aortic
valve calcification. Left anterior descending coronary artery
atherosclerosis. Aortic Atherosclerosis ([id number]-[id number].C). Probably
chronic peripheral ground-glass opacity and honeycombing in the
right lower lobe with cylindrical bronchiectasis and mild airway
thickening, a component of alveolitis is difficult to exclude.
Anterior eventration of the right hemidiaphragm which is mildly
elevated. Nonspecific trace perisplenic ascites. Single bilateral
nonobstructive lower pole calculi. Fatty atrophy of pelvic
musculature is noted above. Dextroconvex lumbar scoliosis with
rotary component. Multilevel lumbar impingement.
